# Patient Record
Sex: Female | Born: 1955 | Race: White | Hispanic: No | Smoking: Former smoker
Health system: Southern US, Community
[De-identification: ages and names within clinical notes are randomized; demographics above are authoritative.]

## PROBLEM LIST (undated history)

## (undated) DIAGNOSIS — Z9889 Other specified postprocedural states: Secondary | ICD-10-CM

## (undated) DIAGNOSIS — C801 Malignant (primary) neoplasm, unspecified: Secondary | ICD-10-CM

## (undated) DIAGNOSIS — K219 Gastro-esophageal reflux disease without esophagitis: Secondary | ICD-10-CM

## (undated) DIAGNOSIS — N189 Chronic kidney disease, unspecified: Secondary | ICD-10-CM

## (undated) DIAGNOSIS — I10 Essential (primary) hypertension: Secondary | ICD-10-CM

## (undated) DIAGNOSIS — R112 Nausea with vomiting, unspecified: Secondary | ICD-10-CM

## (undated) DIAGNOSIS — I2510 Atherosclerotic heart disease of native coronary artery without angina pectoris: Secondary | ICD-10-CM

## (undated) HISTORY — PX: LUMBAR FUSION: SHX111

## (undated) HISTORY — PX: TUBAL LIGATION: SHX77

## (undated) HISTORY — PX: CHOLECYSTECTOMY: SHX55

## (undated) HISTORY — PX: CARDIAC CATHETERIZATION: SHX172

## (undated) HISTORY — PX: APPENDECTOMY: SHX54

## (undated) HISTORY — PX: OTHER SURGICAL HISTORY: SHX169

## (undated) HISTORY — PX: BACK SURGERY: SHX140

## (undated) HISTORY — PX: JOINT REPLACEMENT: SHX530

---

## 2004-05-28 ENCOUNTER — Other Ambulatory Visit: Payer: Self-pay

## 2004-05-28 ENCOUNTER — Emergency Department: Payer: Self-pay | Admitting: Emergency Medicine

## 2004-06-13 ENCOUNTER — Ambulatory Visit: Payer: Self-pay | Admitting: Cardiovascular Disease

## 2004-08-13 ENCOUNTER — Emergency Department: Payer: Self-pay | Admitting: Emergency Medicine

## 2004-08-16 ENCOUNTER — Emergency Department: Payer: Self-pay | Admitting: Emergency Medicine

## 2004-11-05 ENCOUNTER — Emergency Department: Payer: Self-pay | Admitting: Internal Medicine

## 2007-05-30 ENCOUNTER — Other Ambulatory Visit: Payer: Self-pay

## 2007-05-30 ENCOUNTER — Ambulatory Visit: Payer: Self-pay | Admitting: Surgery

## 2007-06-02 ENCOUNTER — Ambulatory Visit: Payer: Self-pay | Admitting: Surgery

## 2007-10-10 ENCOUNTER — Inpatient Hospital Stay: Payer: Self-pay | Admitting: Internal Medicine

## 2008-03-28 ENCOUNTER — Emergency Department: Payer: Self-pay | Admitting: Emergency Medicine

## 2008-04-15 ENCOUNTER — Ambulatory Visit: Payer: Self-pay | Admitting: Orthopedic Surgery

## 2008-05-03 ENCOUNTER — Encounter: Payer: Self-pay | Admitting: Orthopedic Surgery

## 2008-05-22 ENCOUNTER — Encounter: Payer: Self-pay | Admitting: Orthopedic Surgery

## 2008-06-15 ENCOUNTER — Ambulatory Visit: Payer: Self-pay | Admitting: Orthopedic Surgery

## 2008-06-24 ENCOUNTER — Ambulatory Visit: Payer: Self-pay | Admitting: Orthopedic Surgery

## 2008-12-01 ENCOUNTER — Ambulatory Visit: Payer: Self-pay | Admitting: Orthopedic Surgery

## 2008-12-21 ENCOUNTER — Ambulatory Visit (HOSPITAL_COMMUNITY): Admission: RE | Admit: 2008-12-21 | Discharge: 2008-12-21 | Payer: Self-pay | Admitting: Neurological Surgery

## 2010-01-17 ENCOUNTER — Ambulatory Visit: Payer: Self-pay | Admitting: Internal Medicine

## 2010-01-22 ENCOUNTER — Ambulatory Visit: Payer: Self-pay | Admitting: Internal Medicine

## 2010-02-22 ENCOUNTER — Ambulatory Visit: Payer: Self-pay | Admitting: Internal Medicine

## 2010-04-26 LAB — CBC
RBC: 3.95 MIL/uL (ref 3.87–5.11)
WBC: 7.8 10*3/uL (ref 4.0–10.5)

## 2010-04-26 LAB — BASIC METABOLIC PANEL
CO2: 28 mEq/L (ref 19–32)
Chloride: 100 mEq/L (ref 96–112)
GFR calc Af Amer: >60 mL/min (ref >60)
Potassium: 4 mEq/L (ref 3.5–5.1)
Sodium: 135 mEq/L (ref 135–145)

## 2010-06-07 DIAGNOSIS — C44310 Basal cell carcinoma of skin of unspecified parts of face: Secondary | ICD-10-CM | POA: Insufficient documentation

## 2010-09-24 ENCOUNTER — Emergency Department: Payer: Self-pay | Admitting: Emergency Medicine

## 2011-01-03 DIAGNOSIS — L570 Actinic keratosis: Secondary | ICD-10-CM | POA: Insufficient documentation

## 2012-05-05 DIAGNOSIS — Z85828 Personal history of other malignant neoplasm of skin: Secondary | ICD-10-CM | POA: Insufficient documentation

## 2012-05-16 ENCOUNTER — Ambulatory Visit: Payer: Self-pay | Admitting: Internal Medicine

## 2012-10-22 ENCOUNTER — Ambulatory Visit: Payer: Self-pay | Admitting: Neurological Surgery

## 2012-12-05 DIAGNOSIS — K589 Irritable bowel syndrome without diarrhea: Secondary | ICD-10-CM | POA: Insufficient documentation

## 2012-12-05 DIAGNOSIS — K219 Gastro-esophageal reflux disease without esophagitis: Secondary | ICD-10-CM | POA: Insufficient documentation

## 2013-10-08 ENCOUNTER — Ambulatory Visit: Payer: Self-pay | Admitting: Cardiovascular Disease

## 2013-12-21 ENCOUNTER — Emergency Department: Payer: Self-pay | Admitting: Emergency Medicine

## 2013-12-21 LAB — CBC WITH DIFFERENTIAL/PLATELET
Basophil #: 0.1 10*3/uL (ref 0.0–0.1)
Basophil %: 0.4 %
EOS ABS: 0.1 10*3/uL (ref 0.0–0.7)
Eosinophil %: 0.7 %
HCT: 38.3 % (ref 35.0–47.0)
HGB: 12.8 g/dL (ref 12.0–16.0)
LYMPHS PCT: 8.9 %
Lymphocyte #: 1.4 10*3/uL (ref 1.0–3.6)
MCH: 34.3 pg — AB (ref 26.0–34.0)
MCHC: 33.5 g/dL (ref 32.0–36.0)
MCV: 102 fL — AB (ref 80–100)
MONOS PCT: 3.9 %
Monocyte #: 0.6 x10 3/mm (ref 0.2–0.9)
NEUTROS ABS: 13.4 10*3/uL — AB (ref 1.4–6.5)
NEUTROS PCT: 86.1 %
PLATELETS: 212 10*3/uL (ref 150–440)
RBC: 3.74 10*6/uL — AB (ref 3.80–5.20)
RDW: 13.2 % (ref 11.5–14.5)
WBC: 15.6 10*3/uL — ABNORMAL HIGH (ref 3.6–11.0)

## 2013-12-21 LAB — BASIC METABOLIC PANEL
Anion Gap: 8 (ref 7–16)
BUN: 13 mg/dL (ref 7–18)
CALCIUM: 8.6 mg/dL (ref 8.5–10.1)
CREATININE: 0.94 mg/dL (ref 0.60–1.30)
Chloride: 106 mmol/L (ref 98–107)
Co2: 22 mmol/L (ref 21–32)
EGFR (African American): >60
GLUCOSE: 121 mg/dL — AB (ref 65–99)
Osmolality: 273 (ref 275–301)
POTASSIUM: 3.9 mmol/L (ref 3.5–5.1)
Sodium: 136 mmol/L (ref 136–145)

## 2013-12-21 LAB — TROPONIN I: Troponin-I: <0.02 ng/mL

## 2015-02-17 DIAGNOSIS — M545 Low back pain, unspecified: Secondary | ICD-10-CM | POA: Insufficient documentation

## 2015-02-17 DIAGNOSIS — G8929 Other chronic pain: Secondary | ICD-10-CM | POA: Insufficient documentation

## 2015-02-17 DIAGNOSIS — M5410 Radiculopathy, site unspecified: Secondary | ICD-10-CM | POA: Insufficient documentation

## 2015-04-29 DIAGNOSIS — N183 Chronic kidney disease, stage 3 unspecified: Secondary | ICD-10-CM | POA: Insufficient documentation

## 2015-08-02 DIAGNOSIS — Z0289 Encounter for other administrative examinations: Secondary | ICD-10-CM | POA: Insufficient documentation

## 2015-08-23 DIAGNOSIS — M542 Cervicalgia: Secondary | ICD-10-CM | POA: Insufficient documentation

## 2015-11-02 DIAGNOSIS — F5101 Primary insomnia: Secondary | ICD-10-CM | POA: Insufficient documentation

## 2016-02-20 DIAGNOSIS — R1314 Dysphagia, pharyngoesophageal phase: Secondary | ICD-10-CM | POA: Insufficient documentation

## 2016-10-08 ENCOUNTER — Emergency Department
Admission: EM | Admit: 2016-10-08 | Discharge: 2016-10-08 | Disposition: A | Discharge status: Home / Self Care | Payer: 59 | Attending: Emergency Medicine | Admitting: Emergency Medicine

## 2016-10-08 DIAGNOSIS — L02411 Cutaneous abscess of right axilla: Secondary | ICD-10-CM | POA: Insufficient documentation

## 2016-10-08 DIAGNOSIS — L0291 Cutaneous abscess, unspecified: Secondary | ICD-10-CM

## 2016-10-08 DIAGNOSIS — R2231 Localized swelling, mass and lump, right upper limb: Secondary | ICD-10-CM | POA: Diagnosis present

## 2016-10-08 MED ORDER — CLINDAMYCIN HCL 300 MG PO CAPS: 300.0000 mg | ORAL_CAPSULE | Freq: Three times a day (TID) | ORAL | 0 refills | Status: AC

## 2016-10-08 MED ORDER — LIDOCAINE HCL (PF) 1 % IJ SOLN
5.0000 mL | Freq: Once | INTRAMUSCULAR | Status: AC
  Administered 2016-10-08: 5 mL via INTRADERMAL
  Filled 2016-10-08: qty 5

## 2016-10-08 NOTE — ED Provider Notes (Signed)
[facility] Emergency Department Provider Note  ____________________________________________  Time seen: Approximately 7:20 AM  I have reviewed the triage vital signs and the nursing notes.   HISTORY  Chief Complaint Abscess    HPI Monica Cobb is a 61 y.o. female presents to emergency department with painful swelling to right axilla for 8 days. No drainage. No fever, shortness breath, chest pain, nausea, vomiting, abdominal pain.   No past medical history on file.  There are no active problems to display for this patient.   No past surgical history on file.  Prior to Admission medications   Medication Sig Start Date End Date Taking? Authorizing Provider  clindamycin (CLEOCIN) 300 MG capsule Take 1 capsule (300 mg total) by mouth 3 (three) times daily. 10/08/16 10/18/16  Laban Emperor, PA-C    Allergies Patient has no known allergies.  No family history on file.  Social History Social History  Substance Use Topics  . Smoking status: Not on file  . Smokeless tobacco: Not on file  . Alcohol use Not on file     Review of Systems  Constitutional: No fever/chills Cardiovascular: No chest pain. Respiratory: No SOB. Gastrointestinal: No abdominal pain.  No nausea, no vomiting.  Musculoskeletal: Negative for musculoskeletal pain. Skin: Negative for  abrasions, lacerations, ecchymosis   ____________________________________________   PHYSICAL EXAM:  VITAL SIGNS: ED Triage Vitals  Enc Vitals Group     BP 10/08/16 0700 (!) 141/70     Pulse Rate 10/08/16 0700 96     Resp 10/08/16 0700 17     Temp 10/08/16 0700 97.9 F (36.6 C)     Temp Source 10/08/16 0700 Oral     SpO2 10/08/16 0700 96 %     Weight 10/08/16 0700 145 lb (65.8 kg)     Height 10/08/16 0700 5\' 3"  (1.6 m)     Head Circumference --      Peak Flow --      Pain Score 10/08/16 0659 0     Pain Loc --      Pain Edu? --      Excl. in [place]? --      Constitutional: Alert and  oriented. Well appearing and in no acute distress. Eyes: Conjunctivae are normal. PERRL. EOMI. Head: Atraumatic. ENT:      Ears:      Nose: No congestion/rhinnorhea.      Mouth/Throat: Mucous membranes are moist.  Neck: No stridor.   Cardiovascular: Normal rate, regular rhythm.  Good peripheral circulation. Respiratory: Normal respiratory effort without tachypnea or retractions. Lungs CTAB. Good air entry to the bases with no decreased or absent breath sounds.tion to all extremities. No gross deformities appreciated. Neurologic:  Normal speech and language. No gross focal neurologic deficits are appreciated.  Skin:  Skin is warm, dry. 1 cm x 1 cm area of erythema and fluctuance to right axilla. Tender to palpation.   ____________________________________________   LABS (all labs ordered are listed, but only abnormal results are displayed)  Labs Reviewed - No data to display ____________________________________________  EKG   ____________________________________________  RADIOLOGY  No results found.  ____________________________________________    PROCEDURES  Procedure(s) performed:    Procedures  INCISION AND DRAINAGE Performed by: Laban Emperor Consent: Verbal consent obtained. Risks and benefits: risks, benefits and alternatives were discussed Type: abscess  Body area: right axilla  Anesthesia: local infiltration  Incision was made with a scalpel.  Local anesthetic: lidocaine 1% without epinephrine  Anesthetic total: 3 ml  Complexity:  complex Blunt dissection to break up loculations  Drainage: purulent  Drainage amount: 3ccs  Packing material: 1/4 in iodoform gauze  Patient tolerance: Patient tolerated the procedure well with no immediate complications.    Medications  lidocaine (PF) (XYLOCAINE) 1 % injection 5 mL (not administered)     ____________________________________________   INITIAL IMPRESSION / ASSESSMENT AND PLAN / ED  COURSE  Pertinent labs & imaging results that were available during my care of the patient were reviewed by me and considered in my medical decision making (see chart for details).  Review of the [place] CSRS was performed in accordance of the [place] prior to dispensing any controlled drugs.     Patient's diagnosis is consistent with abscess. No signs and exam are reassuring. Abscess was drained and packed in the emergency department.   Patient will be discharged home with prescriptions for clindamycin. Patient is to follow up with PCP as directed. Patient is given ED precautions to return to the ED for any worsening or new symptoms.     ____________________________________________  FINAL CLINICAL IMPRESSION(S) / ED DIAGNOSES  Final diagnoses:  Abscess      NEW MEDICATIONS STARTED DURING THIS VISIT:  New Prescriptions   CLINDAMYCIN (CLEOCIN) 300 MG CAPSULE    Take 1 capsule (300 mg total) by mouth 3 (three) times daily.        This chart was dictated using voice recognition software/Dragon. Despite best efforts to proofread, errors can occur which can change the meaning. Any change was purely unintentional.    Laban Emperor, PA-C 10/08/16 0849    Earleen Newport, MD 10/08/16 [phone]

## 2016-10-08 NOTE — ED Triage Notes (Signed)
Pt reports abscess to right under arm area for 8 days; pain only on palpation; denies drainage; denies fever;

## 2016-10-08 NOTE — ED Notes (Signed)
See triage note  States she developed a red raised area under right arm for several days  Area is tender to touch   But no drainage noted

## 2017-08-05 DIAGNOSIS — E782 Mixed hyperlipidemia: Secondary | ICD-10-CM | POA: Insufficient documentation

## 2018-03-08 DIAGNOSIS — K529 Noninfective gastroenteritis and colitis, unspecified: Secondary | ICD-10-CM | POA: Insufficient documentation

## 2019-01-13 DIAGNOSIS — M1612 Unilateral primary osteoarthritis, left hip: Secondary | ICD-10-CM | POA: Insufficient documentation

## 2019-02-06 ENCOUNTER — Other Ambulatory Visit: Payer: Self-pay | Admitting: Family

## 2019-02-06 DIAGNOSIS — Z1231 Encounter for screening mammogram for malignant neoplasm of breast: Secondary | ICD-10-CM

## 2019-02-10 ENCOUNTER — Ambulatory Visit
Admission: RE | Admit: 2019-02-10 | Discharge: 2019-02-10 | Disposition: A | Discharge status: Home / Self Care | Payer: 59 | Source: Ambulatory Visit | Attending: Family | Admitting: Family

## 2019-02-10 ENCOUNTER — Other Ambulatory Visit: Payer: Self-pay

## 2019-02-10 DIAGNOSIS — Z1231 Encounter for screening mammogram for malignant neoplasm of breast: Secondary | ICD-10-CM | POA: Insufficient documentation

## 2019-02-17 ENCOUNTER — Inpatient Hospital Stay
Admission: RE | Admit: 2019-02-17 | Discharge: 2019-02-17 | Disposition: A | Discharge status: Home / Self Care | Payer: Self-pay | Source: Ambulatory Visit | Attending: *Deleted | Admitting: *Deleted

## 2019-02-17 ENCOUNTER — Other Ambulatory Visit: Payer: Self-pay | Admitting: *Deleted

## 2019-02-17 DIAGNOSIS — Z1231 Encounter for screening mammogram for malignant neoplasm of breast: Secondary | ICD-10-CM

## 2019-11-27 ENCOUNTER — Other Ambulatory Visit (HOSPITAL_COMMUNITY): Payer: Self-pay | Admitting: Nephrology

## 2019-11-27 ENCOUNTER — Other Ambulatory Visit: Payer: Self-pay | Admitting: Nephrology

## 2019-11-27 DIAGNOSIS — N200 Calculus of kidney: Secondary | ICD-10-CM

## 2019-11-27 DIAGNOSIS — N1831 Chronic kidney disease, stage 3a: Secondary | ICD-10-CM

## 2019-12-03 ENCOUNTER — Ambulatory Visit: Payer: 59

## 2019-12-03 ENCOUNTER — Ambulatory Visit
Admission: EM | Admit: 2019-12-03 | Discharge: 2019-12-03 | Disposition: A | Discharge status: Home / Self Care | Payer: 59 | Attending: Emergency Medicine | Admitting: Emergency Medicine

## 2019-12-03 ENCOUNTER — Other Ambulatory Visit: Payer: Self-pay

## 2019-12-03 DIAGNOSIS — J40 Bronchitis, not specified as acute or chronic: Secondary | ICD-10-CM | POA: Diagnosis not present

## 2019-12-03 DIAGNOSIS — R051 Acute cough: Secondary | ICD-10-CM | POA: Diagnosis present

## 2019-12-03 DIAGNOSIS — Z20822 Contact with and (suspected) exposure to covid-19: Secondary | ICD-10-CM | POA: Insufficient documentation

## 2019-12-03 DIAGNOSIS — Z87891 Personal history of nicotine dependence: Secondary | ICD-10-CM | POA: Insufficient documentation

## 2019-12-03 DIAGNOSIS — Z79890 Hormone replacement therapy (postmenopausal): Secondary | ICD-10-CM | POA: Insufficient documentation

## 2019-12-03 DIAGNOSIS — Z791 Long term (current) use of non-steroidal anti-inflammatories (NSAID): Secondary | ICD-10-CM | POA: Insufficient documentation

## 2019-12-03 DIAGNOSIS — Z79899 Other long term (current) drug therapy: Secondary | ICD-10-CM | POA: Diagnosis not present

## 2019-12-03 HISTORY — DX: Essential (primary) hypertension: I10

## 2019-12-03 LAB — SARS CORONAVIRUS 2 (TAT 6-24 HRS): SARS Coronavirus 2: NEGATIVE

## 2019-12-03 MED ORDER — AEROCHAMBER MV MISC
2 refills | Status: DC
Start: 2019-12-03 — End: 2021-03-21

## 2019-12-03 MED ORDER — PROMETHAZINE-DM 6.25-15 MG/5ML PO SYRP: 5.0000 mL | ORAL_SOLUTION | Freq: Four times a day (QID) | ORAL | 0 refills | Status: DC | PRN

## 2019-12-03 MED ORDER — ALBUTEROL SULFATE HFA 108 (90 BASE) MCG/ACT IN AERS: 2.0000 | INHALATION_SPRAY | RESPIRATORY_TRACT | 0 refills | Status: DC | PRN

## 2019-12-03 MED ORDER — BENZONATATE 200 MG PO CAPS: 200.0000 mg | ORAL_CAPSULE | Freq: Three times a day (TID) | ORAL | 1 refills | Status: DC | PRN

## 2019-12-03 NOTE — ED Provider Notes (Signed)
[facility]    CSN: [id number] Arrival date & time: 12/03/19  0801      History   Chief Complaint Chief Complaint  Patient presents with  . Cough    HPI Monica Cobb is a 64 y.o. female.   64 year old female here for evaluation of cough.  Patient reports that her symptoms started about 6 days ago.  She has had some wheezing and runny nose.  She says that she spent last week in the hospital with her husband as he was passing away and thinks he may have picked up something there.  Patient denies fever, shortness of breath, sinus pressure, sore throat, ear pressure, body aches, changes to taste or smell.  Patient has not received her Covid vaccination but she has been vaccinated against the flu.     Past Medical History:  Diagnosis Date  . Hypertension     There are no problems to display for this patient.   History reviewed. No pertinent surgical history.  OB History   No obstetric history on file.      Home Medications    Prior to Admission medications   Medication Sig Start Date End Date Taking? Authorizing Provider  amitriptyline (ELAVIL) 100 MG tablet Take by mouth. 03/04/18 11/08/20 Yes [provider]  atorvastatin (LIPITOR) 20 MG tablet Take by mouth. 03/13/18 02/06/20 Yes [provider]  calcipotriene (DOVONOX) 0.005 % cream Mix 1:1 with Efudex cream twice a day for 4 days on temples, forehead, and nose 11/14/17  Yes [provider]  clonazePAM (KLONOPIN) 1 MG tablet Take by mouth. 08/25/14  Yes [provider]  dicyclomine (BENTYL) 10 MG capsule TAKE 1 CAPSULE (10 MG TOTAL) BY MOUTH 4 (FOUR) TIMES A DAY AS NEEDED (ABDOMINAL PAIN/IBS) 10/13/19  Yes [provider]  ergocalciferol (VITAMIN D2) 1.25 MG (50000 UT) capsule TAKE 1 CAPSULE BY MOUTH ONCE A MONTH. 02/05/18  Yes [provider]  estradiol (CLIMARA - DOSED IN MG/24 HR) 0.05 mg/24hr patch APPLY 1 PATCH ONE TIME PER WEEK 01/28/18  Yes [provider]  furosemide (LASIX) 20 MG tablet Take by mouth. 10/06/19 10/05/20 Yes [provider]  Hyoscyamine Sulfate SL 0.125 MG SUBL Place under the tongue. 12/28/15  Yes [provider]  pantoprazole (PROTONIX) 40 MG tablet Take by mouth. 08/03/14 04/08/20 Yes [provider]  tiZANidine (ZANAFLEX) 4 MG tablet Take by mouth. 08/17/19  Yes [provider]  traMADol-acetaminophen (ULTRACET) 37.5-325 MG tablet Take by mouth. 07/19/14  Yes [provider]  valACYclovir (VALTREX) 1000 MG tablet Take by mouth. 12/04/17 05/17/20 Yes [provider]  valsartan-hydrochlorothiazide (DIOVAN-HCT) 320-12.5 MG tablet daily. 08/14/14  Yes [provider]  albuterol (VENTOLIN HFA) 108 (90 Base) MCG/ACT inhaler Inhale 2 puffs into the lungs every 4 (four) hours as needed for wheezing or shortness of breath. 12/03/19   Margarette Canada, NP  benzonatate (TESSALON) 200 MG capsule Take 1 capsule (200 mg total) by mouth 3 (three) times daily as needed for cough. 12/03/19   Margarette Canada, NP  promethazine-dextromethorphan (PROMETHAZINE-DM) 6.25-15 MG/5ML syrup Take 5 mLs by mouth 4 (four) times daily as needed. 12/03/19   Margarette Canada, NP  Spacer/Aero-Holding Josiah Lobo (AEROCHAMBER MV) inhaler Use as instructed 12/03/19   Margarette Canada, NP    Family History Family History  Problem Relation Age of Onset  . Breast cancer Maternal Grandmother     Social History Social History   Tobacco Use  . Smoking status: Former Smoker  Packs/day: 0.50    Types: Cigarettes  . Smokeless tobacco: Never Used  Vaping Use  . Vaping Use: Never used  Substance Use Topics  . Alcohol use: Not Currently  . Drug use: Never     Allergies   Patient has no known allergies.   Review of Systems Review of Systems  Constitutional: Negative for activity change, appetite change and fever.  HENT: Positive for rhinorrhea. Negative for congestion, ear pain, sinus pressure and sore  throat.   Respiratory: Positive for cough and wheezing. Negative for shortness of breath.   Cardiovascular: Negative for chest pain.  Gastrointestinal: Negative for abdominal pain, diarrhea, nausea and vomiting.  Musculoskeletal: Negative for arthralgias and myalgias.  Skin: Negative for rash.  Neurological: Negative for headaches.  Hematological: Negative.   Psychiatric/Behavioral: Negative.      Physical Exam Triage Vital Signs ED Triage Vitals  Enc Vitals Group     BP 12/03/19 0817 139/69     Pulse Rate 12/03/19 0817 98     Resp 12/03/19 0817 19     Temp 12/03/19 0817 98.3 F (36.8 C)     Temp Source 12/03/19 0817 Oral     SpO2 12/03/19 0817 98 %     Weight 12/03/19 0812 145 lb (65.8 kg)     Height 12/03/19 0812 5\' 3"  (1.6 m)     Head Circumference --      Peak Flow --      Pain Score 12/03/19 0812 0     Pain Loc --      Pain Edu? --      Excl. in [place]? --    No data found.  Updated Vital Signs BP 139/69 (BP Location: Left Arm)   Pulse 98   Temp 98.3 F (36.8 C) (Oral)   Resp 19   Ht 5\' 3"  (1.6 m)   Wt 145 lb (65.8 kg)   SpO2 98%   BMI 25.69 kg/m   Visual Acuity Right Eye Distance:   Left Eye Distance:   Bilateral Distance:    Right Eye Near:   Left Eye Near:    Bilateral Near:     Physical Exam Vitals and nursing note reviewed.  Constitutional:      General: She is not in acute distress.    Appearance: Normal appearance. She is not toxic-appearing.  HENT:     Head: Normocephalic and atraumatic.     Right Ear: Tympanic membrane, ear canal and external ear normal. There is no impacted cerumen.     Left Ear: Tympanic membrane, ear canal and external ear normal. There is no impacted cerumen.     Nose: Rhinorrhea present.     Comments: Patient has scant clear rhinorrhea without erythema or edema of nasal mucosa.    Mouth/Throat:     Mouth: Mucous membranes are moist.     Pharynx: Oropharynx is clear. No oropharyngeal exudate or posterior oropharyngeal  erythema.  Eyes:     General: No scleral icterus.    Extraocular Movements: Extraocular movements intact.     Conjunctiva/sclera: Conjunctivae normal.     Pupils: Pupils are equal, round, and reactive to light.  Cardiovascular:     Rate and Rhythm: Normal rate and regular rhythm.     Pulses: Normal pulses.     Heart sounds: Normal heart sounds. No murmur heard.  No gallop.   Pulmonary:     Effort: Pulmonary effort is normal.     Breath sounds: Normal breath sounds. No wheezing, rhonchi  or rales.  Musculoskeletal:        General: No swelling or tenderness. Normal range of motion.     Cervical back: Normal range of motion and neck supple.  Lymphadenopathy:     Cervical: No cervical adenopathy.  Skin:    General: Skin is warm and dry.     Capillary Refill: Capillary refill takes less than 2 seconds.     Findings: No erythema or rash.  Neurological:     General: No focal deficit present.     Mental Status: She is alert and oriented to person, place, and time.  Psychiatric:        Mood and Affect: Mood normal.        Behavior: Behavior normal.        Thought Content: Thought content normal.        Judgment: Judgment normal.      UC Treatments / Results  Labs (all labs ordered are listed, but only abnormal results are displayed) Labs Reviewed  SARS CORONAVIRUS 2 (TAT 6-24 HRS)    EKG   Radiology No results found.  Procedures Procedures (including critical care time)  Medications Ordered in UC Medications - No data to display  Initial Impression / Assessment and Plan / UC Course  I have reviewed the triage vital signs and the nursing notes.  Pertinent labs & imaging results that were available during my care of the patient were reviewed by me and considered in my medical decision making (see chart for details).   Patient is here for evaluation of cough with green sputum production that she had for the past 6 days.  Patient denies any fever.  No shortness of breath  intermittent wheezing.  Physical exam reveals faint rhonchi and wheezes in the upper and middle lobes bilaterally.  Will treat patient for bronchitis with albuterol inhaler and spacer, Tessalon Perles, and Promethazine DM for bedtime.  Patient structured to return or follow-up with her PCP for worsening symptoms.   Final Clinical Impressions(s) / UC Diagnoses   Final diagnoses:  Bronchitis     Discharge Instructions     Use the albuterol inhaler with the spacer, 2 puffs every 6 hours, as needed for wheezing or shortness of breath.  Continue the Tessalon Perles 200 mg every 8 hours as needed for cough.  Take it with a small sip of water.  Use the Promethazine DM every 6 hours as needed for severe cough.  It will make you drowsy so you may want a status for bedtime.  If your symptoms continue, you develop a productive cough for a rusty-looking sputum, or you develop a fever you need to return for reevaluation or see your PCP.    ED Prescriptions    Medication Sig Dispense Auth. Provider   benzonatate (TESSALON) 200 MG capsule Take 1 capsule (200 mg total) by mouth 3 (three) times daily as needed for cough. 20 capsule Margarette Canada, NP   promethazine-dextromethorphan (PROMETHAZINE-DM) 6.25-15 MG/5ML syrup Take 5 mLs by mouth 4 (four) times daily as needed. 118 mL Margarette Canada, NP   albuterol (VENTOLIN HFA) 108 (90 Base) MCG/ACT inhaler Inhale 2 puffs into the lungs every 4 (four) hours as needed for wheezing or shortness of breath. 18 g Margarette Canada, NP   Spacer/Aero-Holding Chambers (AEROCHAMBER MV) inhaler Use as instructed 1 each Margarette Canada, NP     PDMP not reviewed this encounter.   Margarette Canada, NP 12/03/19 [phone]

## 2019-12-03 NOTE — Discharge Instructions (Addendum)
Use the albuterol inhaler with the spacer, 2 puffs every 6 hours, as needed for wheezing or shortness of breath.  Continue the Tessalon Perles 200 mg every 8 hours as needed for cough.  Take it with a small sip of water.  Use the Promethazine DM every 6 hours as needed for severe cough.  It will make you drowsy so you may want a status for bedtime.  If your symptoms continue, you develop a productive cough for a rusty-looking sputum, or you develop a fever you need to return for reevaluation or see your PCP.

## 2019-12-03 NOTE — ED Triage Notes (Signed)
Pt with cough since Friday, coughing up green phlegm. No fever or bodyaches.

## 2020-11-22 ENCOUNTER — Ambulatory Visit: Payer: Self-pay

## 2020-11-22 ENCOUNTER — Encounter: Payer: Self-pay | Admitting: Orthopaedic Surgery

## 2020-11-22 ENCOUNTER — Ambulatory Visit (INDEPENDENT_AMBULATORY_CARE_PROVIDER_SITE_OTHER): Payer: 59 | Admitting: Orthopaedic Surgery

## 2020-11-22 ENCOUNTER — Other Ambulatory Visit: Payer: Self-pay

## 2020-11-22 DIAGNOSIS — M25551 Pain in right hip: Secondary | ICD-10-CM

## 2020-11-22 DIAGNOSIS — M87051 Idiopathic aseptic necrosis of right femur: Secondary | ICD-10-CM | POA: Diagnosis not present

## 2020-11-22 MED ORDER — TRAMADOL HCL 50 MG PO TABS: 50.0000 mg | ORAL_TABLET | Freq: Two times a day (BID) | ORAL | 0 refills | Status: DC | PRN

## 2020-11-22 NOTE — Progress Notes (Signed)
Office Visit Note   Patient: Monica Cobb           Date of Birth: Feb 14, 1955           MRN: [id number] Visit Date: 11/22/2020              Requested by: Darrick Huntsman, MD [place],  [place] [address] PCP: Darrick Huntsman, MD   Assessment & Plan: Visit Diagnoses:  1. Avascular necrosis of bone of right hip (HCC)     Plan: Impression is right femoral head avascular necrosis with recent collapse and worsening of symptoms.  X-rays are consistent with progressive deterioration of the femoral head.  Given these findings I have recommended a right total hip replacement.  Again we reviewed risk benefits rehab recovery all questions answered to her satisfaction.  Jackelyn Poling will speak with her today to schedule her surgery.  Tramadol refilled today.  Follow-Up Instructions: No follow-ups on file.   Orders:  Orders Placed This Encounter  Procedures   XR HIP UNILAT W OR W/O PELVIS 2-3 VIEWS RIGHT   Meds ordered this encounter  Medications   traMADol (ULTRAM) 50 MG tablet    Sig: Take 1-2 tablets (50-100 mg total) by mouth every 12 (twelve) hours as needed.    Dispense:  40 tablet    Refill:  0      Procedures: No procedures performed   Clinical Data: No additional findings.   Subjective: Chief Complaint  Patient presents with   Right Hip - Pain    Monica Cobb is a 65 year old female who comes in for right hip pain and groin pain since May of this year.  She underwent a left total hip replacement at [facility] in March 2021 for avascular necrosis.  This was done by Dr. Jaclynn Guarneri and she had an uneventful postoperative course.  Recently she has had severe pain in the right hip and has now had to use a Rollator.  She is unable to do any activities without severe pain.  She is at risk for falling.  Currently taking hydrocodone for the pain.   Review of Systems  Constitutional: Negative.   HENT: Negative.    Eyes: Negative.   Respiratory: Negative.    Cardiovascular:  Negative.   Endocrine: Negative.   Musculoskeletal: Negative.   Neurological: Negative.   Hematological: Negative.   Psychiatric/Behavioral: Negative.    All other systems reviewed and are negative.   Objective: Vital Signs: There were no vitals taken for this visit.  Physical Exam Vitals and nursing note reviewed.  Constitutional:      Appearance: She is well-developed.  Pulmonary:     Effort: Pulmonary effort is normal.  Skin:    General: Skin is warm.     Capillary Refill: Capillary refill takes less than 2 seconds.  Neurological:     Mental Status: She is alert and oriented to person, place, and time.  Psychiatric:        Behavior: Behavior normal.        Thought Content: Thought content normal.        Judgment: Judgment normal.    Ortho Exam  Right hip shows significant pain with attempted range of motion.  Antalgic gait with weightbearing.  Lateral hip is nontender.  Hip flexion is limited to 70 degrees with severe pain.  Specialty Comments:  No specialty comments available.  Imaging: XR HIP UNILAT W OR W/O PELVIS 2-3 VIEWS RIGHT  Result Date: 11/22/2020 Left total hip replacement without  complication.  Right femoral head collapse consistent with AVN.    PMFS History: Patient Active Problem List   Diagnosis Date Noted   Avascular necrosis of bone of right hip ([place]) 11/22/2020   Past Medical History:  Diagnosis Date   Hypertension     Family History  Problem Relation Age of Onset   Breast cancer Maternal Grandmother     History reviewed. No pertinent surgical history. Social History   Occupational History   Not on file  Tobacco Use   Smoking status: Former    Packs/day: 0.50    Types: Cigarettes   Smokeless tobacco: Never  Vaping Use   Vaping Use: Never used  Substance and Sexual Activity   Alcohol use: Not Currently   Drug use: Never   Sexual activity: Not on file

## 2020-12-08 ENCOUNTER — Other Ambulatory Visit: Payer: Self-pay

## 2020-12-09 ENCOUNTER — Emergency Department: Payer: 59

## 2020-12-09 ENCOUNTER — Inpatient Hospital Stay
Admission: EM | Admit: 2020-12-09 | Discharge: 2020-12-12 | DRG: 872 | Disposition: A | Discharge status: Home / Self Care | Payer: 59 | Attending: Internal Medicine | Admitting: Internal Medicine

## 2020-12-09 ENCOUNTER — Other Ambulatory Visit: Payer: Self-pay

## 2020-12-09 DIAGNOSIS — G4700 Insomnia, unspecified: Secondary | ICD-10-CM | POA: Diagnosis present

## 2020-12-09 DIAGNOSIS — L0100 Impetigo, unspecified: Secondary | ICD-10-CM | POA: Diagnosis present

## 2020-12-09 DIAGNOSIS — J020 Streptococcal pharyngitis: Secondary | ICD-10-CM | POA: Diagnosis present

## 2020-12-09 DIAGNOSIS — K219 Gastro-esophageal reflux disease without esophagitis: Secondary | ICD-10-CM | POA: Diagnosis present

## 2020-12-09 DIAGNOSIS — A419 Sepsis, unspecified organism: Secondary | ICD-10-CM | POA: Diagnosis not present

## 2020-12-09 DIAGNOSIS — J449 Chronic obstructive pulmonary disease, unspecified: Secondary | ICD-10-CM | POA: Diagnosis present

## 2020-12-09 DIAGNOSIS — Z981 Arthrodesis status: Secondary | ICD-10-CM

## 2020-12-09 DIAGNOSIS — Z87891 Personal history of nicotine dependence: Secondary | ICD-10-CM

## 2020-12-09 DIAGNOSIS — A400 Sepsis due to streptococcus, group A: Principal | ICD-10-CM | POA: Diagnosis present

## 2020-12-09 DIAGNOSIS — E871 Hypo-osmolality and hyponatremia: Secondary | ICD-10-CM | POA: Diagnosis present

## 2020-12-09 DIAGNOSIS — E785 Hyperlipidemia, unspecified: Secondary | ICD-10-CM | POA: Diagnosis present

## 2020-12-09 DIAGNOSIS — M549 Dorsalgia, unspecified: Secondary | ICD-10-CM

## 2020-12-09 DIAGNOSIS — M545 Low back pain, unspecified: Secondary | ICD-10-CM | POA: Diagnosis present

## 2020-12-09 DIAGNOSIS — Z20822 Contact with and (suspected) exposure to covid-19: Secondary | ICD-10-CM | POA: Diagnosis present

## 2020-12-09 DIAGNOSIS — F419 Anxiety disorder, unspecified: Secondary | ICD-10-CM

## 2020-12-09 DIAGNOSIS — Z28310 Unvaccinated for covid-19: Secondary | ICD-10-CM

## 2020-12-09 DIAGNOSIS — Z79890 Hormone replacement therapy (postmenopausal): Secondary | ICD-10-CM

## 2020-12-09 DIAGNOSIS — I10 Essential (primary) hypertension: Secondary | ICD-10-CM

## 2020-12-09 DIAGNOSIS — Z85828 Personal history of other malignant neoplasm of skin: Secondary | ICD-10-CM

## 2020-12-09 DIAGNOSIS — G8929 Other chronic pain: Secondary | ICD-10-CM | POA: Diagnosis present

## 2020-12-09 DIAGNOSIS — Z79899 Other long term (current) drug therapy: Secondary | ICD-10-CM

## 2020-12-09 LAB — COMPREHENSIVE METABOLIC PANEL
ALT: 51 U/L — ABNORMAL HIGH (ref 0–44)
AST: 41 U/L (ref 15–41)
Albumin: 3.7 g/dL (ref 3.5–5.0)
Alkaline Phosphatase: 105 U/L (ref 38–126)
Anion gap: 10 (ref 5–15)
BUN: 13 mg/dL (ref 8–23)
CO2: 25 mmol/L (ref 22–32)
Calcium: 8.7 mg/dL — ABNORMAL LOW (ref 8.9–10.3)
Chloride: 94 mmol/L — ABNORMAL LOW (ref 98–111)
Creatinine, Ser: 1.03 mg/dL — ABNORMAL HIGH (ref 0.44–1.00)
GFR, Estimated: >60 mL/min (ref >60)
Glucose, Bld: 121 mg/dL — ABNORMAL HIGH (ref 70–99)
Potassium: 3.9 mmol/L (ref 3.5–5.1)
Sodium: 129 mmol/L — ABNORMAL LOW (ref 135–145)
Total Bilirubin: 0.7 mg/dL (ref 0.3–1.2)
Total Protein: 7.6 g/dL (ref 6.5–8.1)

## 2020-12-09 LAB — LACTIC ACID, PLASMA
Lactic Acid, Venous: 1.6 mmol/L (ref 0.5–1.9)
Lactic Acid, Venous: 1.9 mmol/L (ref 0.5–1.9)

## 2020-12-09 LAB — URINALYSIS, COMPLETE (UACMP) WITH MICROSCOPIC
Bilirubin Urine: NEGATIVE
Glucose, UA: NEGATIVE mg/dL
Hgb urine dipstick: NEGATIVE
Ketones, ur: NEGATIVE mg/dL
Nitrite: NEGATIVE
Protein, ur: NEGATIVE mg/dL
Specific Gravity, Urine: 1.016 (ref 1.005–1.030)
pH: 5 (ref 5.0–8.0)

## 2020-12-09 LAB — URINALYSIS, ROUTINE W REFLEX MICROSCOPIC
Bilirubin Urine: NEGATIVE
Glucose, UA: NEGATIVE mg/dL
Hgb urine dipstick: NEGATIVE
Ketones, ur: NEGATIVE mg/dL
Leukocytes,Ua: NEGATIVE
Nitrite: NEGATIVE
Protein, ur: NEGATIVE mg/dL
Specific Gravity, Urine: 1.016 (ref 1.005–1.030)
pH: 5 (ref 5.0–8.0)

## 2020-12-09 LAB — CBC WITH DIFFERENTIAL/PLATELET
Abs Immature Granulocytes: 0.2 10*3/uL — ABNORMAL HIGH (ref 0.00–0.07)
Basophils Absolute: 0.1 10*3/uL (ref 0.0–0.1)
Basophils Relative: 0 %
Eosinophils Absolute: 0 10*3/uL (ref 0.0–0.5)
Eosinophils Relative: 0 %
HCT: 35.1 % — ABNORMAL LOW (ref 36.0–46.0)
Hemoglobin: 11.6 g/dL — ABNORMAL LOW (ref 12.0–15.0)
Immature Granulocytes: 1 %
Lymphocytes Relative: 6 %
Lymphs Abs: 1.5 10*3/uL (ref 0.7–4.0)
MCH: 29.5 pg (ref 26.0–34.0)
MCHC: 33 g/dL (ref 30.0–36.0)
MCV: 89.3 fL (ref 80.0–100.0)
Monocytes Absolute: 1.8 10*3/uL — ABNORMAL HIGH (ref 0.1–1.0)
Monocytes Relative: 8 %
Neutro Abs: 20.5 10*3/uL — ABNORMAL HIGH (ref 1.7–7.7)
Neutrophils Relative %: 85 %
Platelets: 253 10*3/uL (ref 150–400)
RBC: 3.93 MIL/uL (ref 3.87–5.11)
RDW: 16.1 % — ABNORMAL HIGH (ref 11.5–15.5)
WBC: 24.2 10*3/uL — ABNORMAL HIGH (ref 4.0–10.5)
nRBC: 0 % (ref 0.0–0.2)

## 2020-12-09 LAB — RESP PANEL BY RT-PCR (FLU A&B, COVID) ARPGX2
Influenza A by PCR: NEGATIVE
Influenza B by PCR: NEGATIVE
SARS Coronavirus 2 by RT PCR: NEGATIVE

## 2020-12-09 LAB — PROTIME-INR
INR: 1.2 (ref 0.8–1.2)
Prothrombin Time: 15 seconds (ref 11.4–15.2)

## 2020-12-09 LAB — APTT: aPTT: 35 seconds (ref 24–36)

## 2020-12-09 LAB — GROUP A STREP BY PCR: Group A Strep by PCR: DETECTED — AB

## 2020-12-09 MED ORDER — ONDANSETRON HCL 4 MG/2ML IJ SOLN: 4.0000 mg | Freq: Four times a day (QID) | INTRAMUSCULAR | Status: DC | PRN

## 2020-12-09 MED ORDER — ACETAMINOPHEN 325 MG PO TABS
650.0000 mg | ORAL_TABLET | Freq: Four times a day (QID) | ORAL | Status: DC | PRN
  Administered 2020-12-10 (×2): 650 mg via ORAL
  Filled 2020-12-09 (×2): qty 2

## 2020-12-09 MED ORDER — ACETAMINOPHEN 325 MG PO TABS: 650.0000 mg | ORAL_TABLET | Freq: Once | ORAL | Status: AC | PRN

## 2020-12-09 MED ORDER — LACTATED RINGERS IV SOLN: INTRAVENOUS | Status: AC

## 2020-12-09 MED ORDER — SODIUM CHLORIDE 0.9 % IV SOLN
2.0000 g | Freq: Once | INTRAVENOUS | Status: AC
  Administered 2020-12-09: 2 g via INTRAVENOUS
  Filled 2020-12-09: qty 2

## 2020-12-09 MED ORDER — VANCOMYCIN HCL 1500 MG/300ML IV SOLN
1500.0000 mg | Freq: Once | INTRAVENOUS | Status: AC
  Administered 2020-12-09: 1500 mg via INTRAVENOUS
  Filled 2020-12-09: qty 300

## 2020-12-09 MED ORDER — METRONIDAZOLE 500 MG/100ML IV SOLN: 500.0000 mg | Freq: Two times a day (BID) | INTRAVENOUS | Status: DC

## 2020-12-09 MED ORDER — ALBUTEROL SULFATE HFA 108 (90 BASE) MCG/ACT IN AERS: 2.0000 | INHALATION_SPRAY | RESPIRATORY_TRACT | Status: DC | PRN

## 2020-12-09 MED ORDER — ONDANSETRON HCL 4 MG PO TABS: 4.0000 mg | ORAL_TABLET | Freq: Four times a day (QID) | ORAL | Status: DC | PRN

## 2020-12-09 MED ORDER — KETOROLAC TROMETHAMINE 30 MG/ML IJ SOLN
30.0000 mg | Freq: Once | INTRAMUSCULAR | Status: AC
  Administered 2020-12-09: 30 mg via INTRAVENOUS
  Filled 2020-12-09: qty 1

## 2020-12-09 MED ORDER — ACETAMINOPHEN 650 MG RE SUPP
650.0000 mg | Freq: Four times a day (QID) | RECTAL | Status: DC | PRN
  Filled 2020-12-09: qty 1

## 2020-12-09 MED ORDER — ACETAMINOPHEN 325 MG PO TABS
ORAL_TABLET | ORAL | Status: AC
  Administered 2020-12-09: 650 mg via ORAL
  Filled 2020-12-09: qty 2

## 2020-12-09 MED ORDER — AMOXICILLIN-POT CLAVULANATE 500-125 MG PO TABS
1.0000 | ORAL_TABLET | Freq: Two times a day (BID) | ORAL | Status: DC
  Administered 2020-12-10 – 2020-12-12 (×6): 500 mg via ORAL
  Filled 2020-12-09 (×8): qty 1

## 2020-12-09 MED ORDER — LIDOCAINE HCL (PF) 1 % IJ SOLN
5.0000 mL | Freq: Once | INTRAMUSCULAR | Status: AC
  Administered 2020-12-09: 5 mL
  Filled 2020-12-09: qty 5

## 2020-12-09 MED ORDER — SODIUM CHLORIDE 0.9 % IV BOLUS (SEPSIS)
2000.0000 mL | Freq: Once | INTRAVENOUS | Status: AC
  Administered 2020-12-09: 2000 mL via INTRAVENOUS

## 2020-12-09 MED ORDER — HEPARIN SODIUM (PORCINE) 5000 UNIT/ML IJ SOLN
5000.0000 [IU] | Freq: Three times a day (TID) | INTRAMUSCULAR | Status: DC
  Administered 2020-12-10 – 2020-12-12 (×7): 5000 [IU] via SUBCUTANEOUS
  Filled 2020-12-09 (×5): qty 1

## 2020-12-09 MED ORDER — LORAZEPAM 2 MG/ML IJ SOLN
1.0000 mg | Freq: Once | INTRAMUSCULAR | Status: AC
  Administered 2020-12-09: 1 mg via INTRAVENOUS
  Filled 2020-12-09: qty 1

## 2020-12-09 MED ORDER — ATORVASTATIN CALCIUM 20 MG PO TABS
20.0000 mg | ORAL_TABLET | Freq: Every day | ORAL | Status: DC
  Administered 2020-12-10 – 2020-12-11 (×3): 20 mg via ORAL
  Filled 2020-12-09 (×2): qty 1

## 2020-12-09 MED ORDER — VANCOMYCIN HCL IN DEXTROSE 1-5 GM/200ML-% IV SOLN: 1000.0000 mg | Freq: Once | INTRAVENOUS | Status: DC

## 2020-12-09 MED ORDER — METRONIDAZOLE 500 MG/100ML IV SOLN
500.0000 mg | Freq: Once | INTRAVENOUS | Status: DC
  Filled 2020-12-09: qty 100

## 2020-12-09 NOTE — Sepsis Progress Note (Signed)
Following for sepsis monitoring ?

## 2020-12-09 NOTE — ED Notes (Signed)
Report from [place], rn.

## 2020-12-09 NOTE — ED Notes (Signed)
Per dr. Kerman Passey hold antibiotics until lumbar puncture performed.

## 2020-12-09 NOTE — ED Notes (Signed)
Paper consent placed in chart for LP.

## 2020-12-09 NOTE — H&P (Signed)
History and Physical   Monica Cobb HQP:[id number] DOB: [birth year] DOA: 12/09/2020  PCP: Darrick Huntsman, MD  Outpatient Specialists: Dr. Meriel Pica, [facility] Patient coming from: home  I have personally briefly reviewed patient's old medical records in [place].  Chief Concern: Neck pain and fever  HPI: Monica Cobb is a 65 y.o. female with medical history significant for hypertension, anxiety, chronic pain, chronic low back pain status post lumbar fusion, hyperlipidemia, history of basal cell carcinoma at the right foot, who presents emergency department for chief concerns of neck pain and fever.  She reports bilateral shoulder and neck soreness started on 12/08/2020 and was a gradual onset. She feels like there is something stuck in her chest and throat..  She reports never feeling this way before.  She reports a sensation and neck and shoulder soreness are still there.  She endorses a mild headache.  She reports that her grandson's daycare had kids that had strep throat this week. She reports that her throat has been feeling rough and scratchy when she ate, and this started around Monday. She denies loc, syncope, chest pain, shortness of breath,dysuria, cough.  She denies nausea, vomiting, diarrhea, swelling in her legs, vision changes.  She endorses fever with Tmax of 105 at home.   At bedside there is tonsillar exudate on physical exam, L>R, and mild erythema bilaterally.  Patient had small impetigo rash at the edge of her mouth, approximately 5 to 6 mm in diameter.  Social history: She lives at home and her daughter and grandson lives with her. She is a former tobacco smoker, and smoked at her peak 1 ppd and quit about 1 month ago. She denies etoh and recreational drug use. She is retired and formerly worked in Orthoptist at [employer].   Vaccination history: She is not vaccinated for covid 19   ROS: Constitutional: no weight change, + fever ENT/Mouth: + sore throat, no  rhinorrhea Eyes: no eye pain, no vision changes Cardiovascular: no chest pain, no dyspnea,  no edema, no palpitations Respiratory: no cough, no sputum, no wheezing Gastrointestinal: no nausea, no vomiting, no diarrhea, no constipation Genitourinary: no urinary incontinence, no dysuria, no hematuria Musculoskeletal: no arthralgias, + myalgias, + neck and shoulder pain Skin: no skin lesions, no pruritus, Neuro: + weakness, no loss of consciousness, no syncope Psych: no anxiety, no depression, + decrease appetite Heme/Lymph: no bruising, no bleeding  ED Course: Discussed with emergency medicine provider, patient requiring hospitalization for chief concerns of meeting sepsis criteria.  Initial concern was for meningitis as patient was having shoulder and low neck pain.  Negative for nuchal rigidity.  Vitals in the emergency room was remarkable for T-max of 102.3 and improved to 99.2, respiration rate of 20, heart rate of 107, blood pressure 124/63, SPO2 of 97% on room air.  Labs in the emergency department was remarkable for sodium 129, potassium 3.9, chloride 94, bicarb 25, BUN of 13, serum creatinine of 1.03, nonfasting blood glucose 121, GFR greater than 60, WBC elevated at 24.2, hemoglobin 11.6, platelets 253.  Lactic acid was 1.6 and the second was 1.9.  UA was positive for trace leukocytes.  Assessment/Plan  Principal Problem:   Sepsis ([place]) Active Problems:   Anxiety   Back pain   Pharyngitis, streptococcal, acute   Primary hypertension   Impetigo   # Meets criteria for sepsis with elevated heart rate, elevated temperature, leukocytosis, possible source of strep a, however given patient's presentation of persistent neck pain and  headache, meningitis cannot be excluded at this time - Ordered Augmentin 500 mg p.o. twice daily, 10-day course - Patient was status post vancomycin, cefepime, metronidazole per EDP - Patient had no acute abdomen and was negative with deep palpation,  metronidazole has been discontinued by myself - ED provider attempted lumbar puncture however was unsuccessful as patient has diffuse lumbar spine - Would recommend a.m. team to consider consultation with infectious disease whether or not lumbar puncture is indicated at this time - Blood cultures x2, CSF panel ordered by EDP, cortisol in the a.m., magnesium, phosphorus, procalcitonin ordered - Given the initial presentation concerning for meningitis, acyclovir was ordered - CT of the head without contrast ordered  # History of hypertension-Per family medicine clinic note on 08/24/2020, was to continue valsartan 80 mg daily - I have not resumed antihypertensives given patient's initial presentation of sepsis - Acceptable SBP of 105-170  # Hyperlipidemia-atorvastatin 20 mg nightly resumed  # Anxiety-resumed clonazepam at 1 mg nightly as needed for anxiety, 1 dose ordered  # GERD-PPI resumed  # Patient endorses insomnia-melatonin 5 mg nightly ordered  # A.m. team to complete med reconciliation  Chart reviewed.   DVT prophylaxis: Heparin 5000 units subcutaneous every 8 hours Code Status: full code  Diet: Heart healthy Family Communication: No patient states that her family knows she is in the hospital Disposition Plan: Pending clinical course anticipate less than 2 midnights Consults called: None at this time, a.m. team to consider ID and/or IR consultation Admission status: Progressive cardiac, observation  Past Medical History:  Diagnosis Date   Hypertension    Past Surgical History:  Procedure Laterality Date   LUMBAR FUSION     Social History:  reports that she has quit smoking. Her smoking use included cigarettes. She smoked an average of 1 pack per day. She has never used smokeless tobacco. She reports that she does not currently use alcohol. She reports that she does not use drugs.  No Known Allergies Family History  Problem Relation Age of Onset   Breast cancer Maternal  Grandmother    Family history: Family history reviewed and not pertinent  Prior to Admission medications   Medication Sig Start Date End Date Taking? Authorizing Provider  albuterol (VENTOLIN HFA) 108 (90 Base) MCG/ACT inhaler Inhale 2 puffs into the lungs every 4 (four) hours as needed for wheezing or shortness of breath. 12/03/19   Margarette Canada, NP  amitriptyline (ELAVIL) 100 MG tablet Take by mouth. 03/04/18 11/08/20  [provider]  atorvastatin (LIPITOR) 20 MG tablet Take by mouth. 03/13/18 02/06/20  [provider]  benzonatate (TESSALON) 200 MG capsule Take 1 capsule (200 mg total) by mouth 3 (three) times daily as needed for cough. 12/03/19   Margarette Canada, NP  calcipotriene (DOVONOX) 0.005 % cream Mix 1:1 with Efudex cream twice a day for 4 days on temples, forehead, and nose 11/14/17   [provider]  clonazePAM (KLONOPIN) 1 MG tablet Take by mouth. 08/25/14   [provider]  dicyclomine (BENTYL) 10 MG capsule TAKE 1 CAPSULE (10 MG TOTAL) BY MOUTH 4 (FOUR) TIMES A DAY AS NEEDED (ABDOMINAL PAIN/IBS) 10/13/19   [provider]  ergocalciferol (VITAMIN D2) 1.25 MG (50000 UT) capsule TAKE 1 CAPSULE BY MOUTH ONCE A MONTH. 02/05/18   [provider]  estradiol (CLIMARA - DOSED IN MG/24 HR) 0.05 mg/24hr patch APPLY 1 PATCH ONE TIME PER WEEK 01/28/18   [provider]  furosemide (LASIX) 20 MG tablet Take by  mouth. 10/06/19 10/05/20  [provider]  Hyoscyamine Sulfate SL 0.125 MG SUBL Place under the tongue. 12/28/15   [provider]  pantoprazole (PROTONIX) 40 MG tablet Take by mouth. 08/03/14 04/08/20  [provider]  promethazine-dextromethorphan (PROMETHAZINE-DM) 6.25-15 MG/5ML syrup Take 5 mLs by mouth 4 (four) times daily as needed. 12/03/19   Margarette Canada, NP  Spacer/Aero-Holding Josiah Lobo (AEROCHAMBER MV) inhaler Use as instructed 12/03/19   Margarette Canada, NP  tiZANidine (ZANAFLEX) 4 MG tablet Take by mouth.  08/17/19   [provider]  traMADol (ULTRAM) 50 MG tablet Take 1-2 tablets (50-100 mg total) by mouth every 12 (twelve) hours as needed. 11/22/20   Leandrew Koyanagi, MD  traMADol-acetaminophen Caroline Sauger) 37.5-325 MG tablet Take by mouth. 07/19/14   [provider]  valsartan-hydrochlorothiazide (DIOVAN-HCT) 320-12.5 MG tablet daily. 08/14/14   [provider]   Physical Exam: Vitals:   12/09/20 1932 12/09/20 2200 12/09/20 2248 12/09/20 2300  BP: 124/63 (!) 129/58  (!) 105/50  Pulse: (!) 107 99  92  Resp: 20 20  11   Temp: (!) 102.3 F (39.1 C)  99.2 F (37.3 C)   TempSrc: Oral  Oral   SpO2: 97% 97%  96%  Weight:      Height:       Constitutional: appears age-appropriate, NAD, calm, comfortable Eyes: PERRL, lids and conjunctivae normal ENMT: Mucous membranes are dry. Posterior pharynx positive for exudate or mild erythema. Age-appropriate dentition. Hearing appropriate Neck: normal, supple, no masses, no thyromegaly Respiratory: clear to auscultation bilaterally, no wheezing, no crackles. Normal respiratory effort. No accessory muscle use.  Cardiovascular: Regular rate and rhythm, no murmurs / rubs / gallops. No extremity edema. 2+ pedal pulses. No carotid bruits.  Abdomen: Obese abdomen, no tenderness, no masses palpated, no hepatosplenomegaly. Bowel sounds positive.  Musculoskeletal: no clubbing / cyanosis. No joint deformity upper and lower extremities. Good ROM, no contractures, no atrophy. Normal muscle tone.  Skin: no rashes, lesions, ulcers. No induration Neurologic: Sensation intact. Strength 5/5 in all 4.  Psychiatric: Normal judgment and insight. Alert and oriented x 3. Normal mood.   EKG: independently reviewed, showing sinus rhythm with a rate of 92, QTc 456  Chest x-ray on Admission: I personally reviewed and I agree with radiologist reading as below.  CT HEAD WO CONTRAST (5MM)  Result Date: 12/09/2020 CLINICAL DATA:  Fever, chills, body aches and  headaches. EXAM: CT HEAD WITHOUT CONTRAST TECHNIQUE: Contiguous axial images were obtained from the base of the skull through the vertex without intravenous contrast. COMPARISON:  None. FINDINGS: Brain: No evidence of acute infarction, hemorrhage, hydrocephalus, extra-axial collection or mass lesion/mass effect. Vascular: No hyperdense vessel or unexpected calcification. Skull: Normal. Negative for fracture or focal lesion. Sinuses/Orbits: No acute finding. Other: None. IMPRESSION: No acute intracranial pathology. Electronically Signed   By: Virgina Norfolk M.D.   On: 12/09/2020 23:40   DG Chest Port 1 View  Result Date: 12/09/2020 CLINICAL DATA:  Neck pain. EXAM: PORTABLE CHEST 1 VIEW COMPARISON:  December 21, 2013 FINDINGS: Diffuse, chronic appearing increased lung markings are seen. There is no evidence of acute infiltrate, pleural effusion or pneumothorax. The heart size and mediastinal contours are within normal limits. Moderate severity calcification of the aortic arch is seen. The visualized skeletal structures are unremarkable. IMPRESSION: No acute or active cardiopulmonary disease. Electronically Signed   By: Virgina Norfolk M.D.   On: 12/09/2020 22:12    Labs on Admission: I have personally reviewed following labs  CBC: Recent  Labs  Lab 12/09/20 1944  WBC 24.2*  NEUTROABS 20.5*  HGB 11.6*  HCT 35.1*  MCV 89.3  PLT 937   Basic Metabolic Panel: Recent Labs  Lab 12/09/20 1944  NA 129*  K 3.9  CL 94*  CO2 25  GLUCOSE 121*  BUN 13  CREATININE 1.03*  CALCIUM 8.7*   GFR: Estimated Creatinine Clearance: 51.3 mL/min (A) (by C-G formula based on SCr of 1.03 mg/dL (H)).  Liver Function Tests: Recent Labs  Lab 12/09/20 1944  AST 41  ALT 51*  ALKPHOS 105  BILITOT 0.7  PROT 7.6  ALBUMIN 3.7   Coagulation Profile: Recent Labs  Lab 12/09/20 2206  INR 1.2   Urine analysis:    Component Value Date/Time   COLORURINE YELLOW (A) 12/09/2020 2206   COLORURINE YELLOW  (A) 12/09/2020 2206   APPEARANCEUR HAZY (A) 12/09/2020 2206   APPEARANCEUR HAZY (A) 12/09/2020 2206   LABSPEC 1.016 12/09/2020 2206   LABSPEC 1.016 12/09/2020 2206   PHURINE 5.0 12/09/2020 2206   PHURINE 5.0 12/09/2020 [place] 12/09/2020 [place] 12/09/2020 [place] 12/09/2020 [place] 12/09/2020 [place] 12/09/2020 [place] 12/09/2020 2206   KETONESUR NEGATIVE 12/09/2020 2206   [place] 12/09/2020 [place] 12/09/2020 2206   PROTEINUR NEGATIVE 12/09/2020 2206   NITRITE NEGATIVE 12/09/2020 2206   NITRITE NEGATIVE 12/09/2020 2206   LEUKOCYTESUR NEGATIVE 12/09/2020 2206   LEUKOCYTESUR TRACE (A) 12/09/2020 2206   CRITICAL CARE Performed by: Briant Cedar Tomoko Sandra  Total critical care time: 35 minutes  Critical care time was exclusive of separately billable procedures and treating other patients.  Critical care was necessary to treat or prevent imminent or life-threatening deterioration.  Sepsis secondary to group A, meningitis  Critical care was time spent personally by me on the following activities: development of treatment plan with patient and/or surrogate as well as nursing, discussions with consultants, evaluation of patient's response to treatment, examination of patient, obtaining history from patient or surrogate, ordering and performing treatments and interventions, ordering and review of laboratory studies, ordering and review of radiographic studies, pulse oximetry and re-evaluation of patient's condition.  Dr. Tobie Poet [facility]  If 7PM-7AM, please contact overnight-coverage provider If 7AM-7PM, please contact day coverage provider [email or web address]  12/10/2020, 12:20 AM

## 2020-12-09 NOTE — ED Provider Notes (Signed)
[facility] Emergency Department Provider Note  Time seen: 10:00 PM  I have reviewed the triage vital signs and the nursing notes.   HISTORY  Chief Complaint Fever   HPI Monica Cobb is a 65 y.o. female with a past medical history of hypertension who presents to the emergency department for fever chills body aches and weakness.  According to the patient for the past 4 days or so she has been experiencing fever, body aches, generalized fatigue and weakness.  States intermittent headaches.  States discomfort in her shoulders bilaterally into her chest at times.  Patient states fever as high as 103 today.  Patient was seen by physician earlier today, had a negative COVID/flu PCR test.  Patient denies any cough congestion chest pain abdominal pain nausea vomiting or diarrhea.  No dysuria or vaginal symptoms.  No cuts or scrapes.  No dental pain.   Past Medical History:  Diagnosis Date   Hypertension     Patient Active Problem List   Diagnosis Date Noted   Avascular necrosis of bone of right hip ([place]) 11/22/2020    No past surgical history on file.  Prior to Admission medications   Medication Sig Start Date End Date Taking? Authorizing Provider  albuterol (VENTOLIN HFA) 108 (90 Base) MCG/ACT inhaler Inhale 2 puffs into the lungs every 4 (four) hours as needed for wheezing or shortness of breath. 12/03/19   Margarette Canada, NP  amitriptyline (ELAVIL) 100 MG tablet Take by mouth. 03/04/18 11/08/20  [provider]  atorvastatin (LIPITOR) 20 MG tablet Take by mouth. 03/13/18 02/06/20  [provider]  benzonatate (TESSALON) 200 MG capsule Take 1 capsule (200 mg total) by mouth 3 (three) times daily as needed for cough. 12/03/19   Margarette Canada, NP  calcipotriene (DOVONOX) 0.005 % cream Mix 1:1 with Efudex cream twice a day for 4 days on temples, forehead, and nose 11/14/17   [provider]  clonazePAM (KLONOPIN) 1 MG tablet Take by mouth. 08/25/14    [provider]  dicyclomine (BENTYL) 10 MG capsule TAKE 1 CAPSULE (10 MG TOTAL) BY MOUTH 4 (FOUR) TIMES A DAY AS NEEDED (ABDOMINAL PAIN/IBS) 10/13/19   [provider]  ergocalciferol (VITAMIN D2) 1.25 MG (50000 UT) capsule TAKE 1 CAPSULE BY MOUTH ONCE A MONTH. 02/05/18   [provider]  estradiol (CLIMARA - DOSED IN MG/24 HR) 0.05 mg/24hr patch APPLY 1 PATCH ONE TIME PER WEEK 01/28/18   [provider]  furosemide (LASIX) 20 MG tablet Take by mouth. 10/06/19 10/05/20  [provider]  Hyoscyamine Sulfate SL 0.125 MG SUBL Place under the tongue. 12/28/15   [provider]  pantoprazole (PROTONIX) 40 MG tablet Take by mouth. 08/03/14 04/08/20  [provider]  promethazine-dextromethorphan (PROMETHAZINE-DM) 6.25-15 MG/5ML syrup Take 5 mLs by mouth 4 (four) times daily as needed. 12/03/19   Margarette Canada, NP  Spacer/Aero-Holding Josiah Lobo (AEROCHAMBER MV) inhaler Use as instructed 12/03/19   Margarette Canada, NP  tiZANidine (ZANAFLEX) 4 MG tablet Take by mouth. 08/17/19   [provider]  traMADol (ULTRAM) 50 MG tablet Take 1-2 tablets (50-100 mg total) by mouth every 12 (twelve) hours as needed. 11/22/20   Leandrew Koyanagi, MD  traMADol-acetaminophen Caroline Sauger) 37.5-325 MG tablet Take by mouth. 07/19/14   [provider]  valsartan-hydrochlorothiazide (DIOVAN-HCT) 320-12.5 MG tablet daily. 08/14/14   [provider]    No Known Allergies  Family History  Problem Relation Age of Onset   Breast cancer Maternal Grandmother  Social History Social History   Tobacco Use   Smoking status: Former    Packs/day: 0.50    Types: Cigarettes   Smokeless tobacco: Never  Vaping Use   Vaping Use: Never used  Substance Use Topics   Alcohol use: Not Currently   Drug use: Never    Review of Systems Constitutional: Positive for fever Cardiovascular: Negative for chest pain. Respiratory: Negative for shortness of  breath. Gastrointestinal: Negative for abdominal pain, vomiting and diarrhea. Genitourinary: Negative for urinary compaints Musculoskeletal: Negative for musculoskeletal complaints Skin: Small little rash to her right cheek/lip Neurological: Intermittent headache All other ROS negative  ____________________________________________   PHYSICAL EXAM:  VITAL SIGNS: ED Triage Vitals [12/09/20 1932]  Enc Vitals Group     BP 124/63     Pulse Rate (!) 108     Resp 16     Temp (!) 102.3 F (39.1 C)     Temp Source Oral     SpO2 100 %     Weight 147 lb (66.7 kg)     Height 5\' 3"  (1.6 m)     Head Circumference      Peak Flow      Pain Score 7     Pain Loc      Pain Edu?      Excl. in [place]?    Constitutional: Alert and oriented. Well appearing and in no distress. Eyes: Normal exam ENT      Head: Normocephalic and atraumatic.      Mouth/Throat: Mucous membranes are moist. Cardiovascular: Normal rate, regular rhythm around 100 bpm. Respiratory: Normal respiratory effort without tachypnea nor retractions. Breath sounds are clear  Gastrointestinal: Soft and nontender. No distention. Musculoskeletal: Nontender with normal range of motion in all extremities.  Neurologic:  Normal speech and language. No gross focal neurologic deficits  Skin: Small little rash to her right face/right upper lip, possibly representing HSV. Psychiatric: Mood and affect are normal.  ____________________________________________    EKG  EKG viewed and interpreted by myself shows a normal sinus rhythm at 92 bpm with a narrow QRS, normal axis, normal intervals, nonspecific but no concerning ST changes.  ____________________________________________    RADIOLOGY  Chest x-ray negative  ____________________________________________   INITIAL IMPRESSION / ASSESSMENT AND PLAN / ED COURSE  Pertinent labs & imaging results that were available during my care of the patient were reviewed by me and considered  in my medical decision making (see chart for details).   Patient presents emergency department for fever chills headache generalized fatigue.  Patient's lab work shows significant leukocytosis of 24,000 fever 1-2.3 tachycardic meeting sepsis criteria.  We will check additional labs, cultures, start on broad-spectrum antibiotics and continue to closely monitor.  No obvious infectious source at this time.  Patient denies any cough congestion abdominal pain.  Patient does have a small rash to her face possibly indicating HSV, with the patient's intermittent headaches neck and shoulder pain fever and elevated white blood cell count she would likely benefit from lumbar puncture to evaluate for possible HSV meningitis/encephalitis.  Spoke to the patient regarding lumbar puncture, she is agreeable to plan however patient has had extensive back surgery including lumbar fusions.  Given the very difficult anatomy we will hold off on lumbar puncture until the patient can be done under CT guidance by IR tomorrow.  Very low suspicion for bacterial meningitis, if anything I would be suspicious for HSV/viral meningitis.  We will continue with the plan of admission to the hospital,  IV antibiotics.  Leshea Jaggers was evaluated in Emergency Department on 12/09/2020 for the symptoms described in the history of present illness. She was evaluated in the context of the global COVID-19 pandemic, which necessitated consideration that the patient might be at risk for infection with the SARS-CoV-2 virus that causes COVID-19. Institutional protocols and algorithms that pertain to the evaluation of patients at risk for COVID-19 are in a state of rapid change based on information released by regulatory bodies including the CDC and federal and state organizations. These policies and algorithms were followed during the patient's care in the ED.  CRITICAL CARE Performed by: Harvest Dark   Total critical care time: 30  minutes  Critical care time was exclusive of separately billable procedures and treating other patients.  Critical care was necessary to treat or prevent imminent or life-threatening deterioration.  Critical care was time spent personally by me on the following activities: development of treatment plan with patient and/or surrogate as well as nursing, discussions with consultants, evaluation of patient's response to treatment, examination of patient, obtaining history from patient or surrogate, ordering and performing treatments and interventions, ordering and review of laboratory studies, ordering and review of radiographic studies, pulse oximetry and re-evaluation of patient's condition.  ____________________________________________   FINAL CLINICAL IMPRESSION(S) / ED DIAGNOSES  Sepsis   Harvest Dark, MD 12/09/20 2315

## 2020-12-09 NOTE — ED Triage Notes (Signed)
Pt states was at [facility] this am for similar symptoms. Pt states she has been having neck pain, fever. Pt denies congestion. Pt denies rash. Pt states is also having a headache. Pt states was negative for covid and flu this am.

## 2020-12-09 NOTE — ED Notes (Signed)
Md to wait on lp, hang antibiotics per md.

## 2020-12-09 NOTE — Progress Notes (Signed)
CODE SEPSIS - PHARMACY COMMUNICATION  **Broad Spectrum Antibiotics should be administered within 1 hour of Sepsis diagnosis**  Time Code Sepsis Called/Page Received: 2155  Antibiotics Ordered: Cefepime and Vancomycin  Time of 1st antibiotic administration: [place], PharmD, [facility] 12/09/2020 11:43 PM

## 2020-12-09 NOTE — Consult Note (Signed)
PHARMACY -  BRIEF ANTIBIOTIC NOTE   Pharmacy has received consult(s) for vancomycin and cefepime from an ED provider.  The patient's profile has been reviewed for ht/wt/allergies/indication/available labs.    One time order(s) placed for cefepime 2 g and vancomycin 1500 mg IV  Further antibiotics/pharmacy consults should be ordered by admitting physician if indicated.                       Thank you, Darnelle Bos, PharmD 12/09/2020  10:00 PM

## 2020-12-10 ENCOUNTER — Encounter: Payer: Self-pay | Admitting: Internal Medicine

## 2020-12-10 DIAGNOSIS — A419 Sepsis, unspecified organism: Secondary | ICD-10-CM | POA: Diagnosis present

## 2020-12-10 DIAGNOSIS — Z85828 Personal history of other malignant neoplasm of skin: Secondary | ICD-10-CM | POA: Diagnosis not present

## 2020-12-10 DIAGNOSIS — K219 Gastro-esophageal reflux disease without esophagitis: Secondary | ICD-10-CM | POA: Diagnosis present

## 2020-12-10 DIAGNOSIS — A400 Sepsis due to streptococcus, group A: Secondary | ICD-10-CM | POA: Diagnosis present

## 2020-12-10 DIAGNOSIS — Z20822 Contact with and (suspected) exposure to covid-19: Secondary | ICD-10-CM | POA: Diagnosis present

## 2020-12-10 DIAGNOSIS — L0100 Impetigo, unspecified: Secondary | ICD-10-CM | POA: Diagnosis present

## 2020-12-10 DIAGNOSIS — J020 Streptococcal pharyngitis: Secondary | ICD-10-CM | POA: Diagnosis present

## 2020-12-10 DIAGNOSIS — Z981 Arthrodesis status: Secondary | ICD-10-CM | POA: Diagnosis not present

## 2020-12-10 DIAGNOSIS — E871 Hypo-osmolality and hyponatremia: Secondary | ICD-10-CM | POA: Diagnosis present

## 2020-12-10 DIAGNOSIS — E785 Hyperlipidemia, unspecified: Secondary | ICD-10-CM | POA: Diagnosis present

## 2020-12-10 DIAGNOSIS — F419 Anxiety disorder, unspecified: Secondary | ICD-10-CM

## 2020-12-10 DIAGNOSIS — M545 Low back pain, unspecified: Secondary | ICD-10-CM | POA: Diagnosis present

## 2020-12-10 DIAGNOSIS — I10 Essential (primary) hypertension: Secondary | ICD-10-CM

## 2020-12-10 DIAGNOSIS — Z87891 Personal history of nicotine dependence: Secondary | ICD-10-CM | POA: Diagnosis not present

## 2020-12-10 DIAGNOSIS — Z79890 Hormone replacement therapy (postmenopausal): Secondary | ICD-10-CM | POA: Diagnosis not present

## 2020-12-10 DIAGNOSIS — Z28310 Unvaccinated for covid-19: Secondary | ICD-10-CM | POA: Diagnosis not present

## 2020-12-10 DIAGNOSIS — G4700 Insomnia, unspecified: Secondary | ICD-10-CM | POA: Diagnosis present

## 2020-12-10 DIAGNOSIS — Z79899 Other long term (current) drug therapy: Secondary | ICD-10-CM | POA: Diagnosis not present

## 2020-12-10 DIAGNOSIS — J449 Chronic obstructive pulmonary disease, unspecified: Secondary | ICD-10-CM | POA: Diagnosis present

## 2020-12-10 DIAGNOSIS — G8929 Other chronic pain: Secondary | ICD-10-CM | POA: Diagnosis present

## 2020-12-10 LAB — CBC WITH DIFFERENTIAL/PLATELET
Abs Immature Granulocytes: 0.45 10*3/uL — ABNORMAL HIGH (ref 0.00–0.07)
Basophils Absolute: 0.1 10*3/uL (ref 0.0–0.1)
Basophils Relative: 0 %
Eosinophils Absolute: 0 10*3/uL (ref 0.0–0.5)
Eosinophils Relative: 0 %
HCT: 29.5 % — ABNORMAL LOW (ref 36.0–46.0)
Hemoglobin: 9.6 g/dL — ABNORMAL LOW (ref 12.0–15.0)
Immature Granulocytes: 2 %
Lymphocytes Relative: 4 %
Lymphs Abs: 0.9 10*3/uL (ref 0.7–4.0)
MCH: 29.1 pg (ref 26.0–34.0)
MCHC: 32.5 g/dL (ref 30.0–36.0)
MCV: 89.4 fL (ref 80.0–100.0)
Monocytes Absolute: 1.5 10*3/uL — ABNORMAL HIGH (ref 0.1–1.0)
Monocytes Relative: 6 %
Neutro Abs: 19.9 10*3/uL — ABNORMAL HIGH (ref 1.7–7.7)
Neutrophils Relative %: 88 %
Platelets: 207 10*3/uL (ref 150–400)
RBC: 3.3 MIL/uL — ABNORMAL LOW (ref 3.87–5.11)
RDW: 16.6 % — ABNORMAL HIGH (ref 11.5–15.5)
WBC: 22.9 10*3/uL — ABNORMAL HIGH (ref 4.0–10.5)
nRBC: 0 % (ref 0.0–0.2)

## 2020-12-10 LAB — PHOSPHORUS: Phosphorus: 2.9 mg/dL (ref 2.5–4.6)

## 2020-12-10 LAB — BASIC METABOLIC PANEL
Anion gap: 7 (ref 5–15)
BUN: 14 mg/dL (ref 8–23)
CO2: 23 mmol/L (ref 22–32)
Calcium: 8 mg/dL — ABNORMAL LOW (ref 8.9–10.3)
Chloride: 102 mmol/L (ref 98–111)
Creatinine, Ser: 0.97 mg/dL (ref 0.44–1.00)
GFR, Estimated: >60 mL/min (ref >60)
Glucose, Bld: 120 mg/dL — ABNORMAL HIGH (ref 70–99)
Potassium: 3 mmol/L — ABNORMAL LOW (ref 3.5–5.1)
Sodium: 132 mmol/L — ABNORMAL LOW (ref 135–145)

## 2020-12-10 LAB — PROTIME-INR
INR: 1.3 — ABNORMAL HIGH (ref 0.8–1.2)
Prothrombin Time: 15.9 seconds — ABNORMAL HIGH (ref 11.4–15.2)

## 2020-12-10 LAB — PROCALCITONIN: Procalcitonin: 1.9 ng/mL

## 2020-12-10 LAB — CORTISOL-AM, BLOOD: Cortisol - AM: 19 ug/dL (ref 6.7–22.6)

## 2020-12-10 LAB — MAGNESIUM: Magnesium: 1.8 mg/dL (ref 1.7–2.4)

## 2020-12-10 LAB — HIV ANTIBODY (ROUTINE TESTING W REFLEX): HIV Screen 4th Generation wRfx: NONREACTIVE

## 2020-12-10 MED ORDER — VANCOMYCIN HCL 500 MG/100ML IV SOLN
500.0000 mg | Freq: Two times a day (BID) | INTRAVENOUS | Status: DC
  Administered 2020-12-10: 500 mg via INTRAVENOUS
  Filled 2020-12-10: qty 100

## 2020-12-10 MED ORDER — ALBUTEROL SULFATE (2.5 MG/3ML) 0.083% IN NEBU: 2.5000 mg | INHALATION_SOLUTION | RESPIRATORY_TRACT | Status: DC | PRN

## 2020-12-10 MED ORDER — MELATONIN 5 MG PO TABS
5.0000 mg | ORAL_TABLET | Freq: Every day | ORAL | Status: DC
  Administered 2020-12-10 – 2020-12-11 (×2): 5 mg via ORAL
  Filled 2020-12-10 (×2): qty 1

## 2020-12-10 MED ORDER — SODIUM CHLORIDE 0.9 % IV SOLN
2.0000 g | Freq: Two times a day (BID) | INTRAVENOUS | Status: DC
  Administered 2020-12-10 – 2020-12-11 (×3): 2 g via INTRAVENOUS
  Filled 2020-12-10 (×4): qty 2

## 2020-12-10 MED ORDER — POTASSIUM CHLORIDE IN NACL 20-0.9 MEQ/L-% IV SOLN
INTRAVENOUS | Status: DC
  Filled 2020-12-10 (×5): qty 1000

## 2020-12-10 MED ORDER — BENZONATATE 100 MG PO CAPS: 200.0000 mg | ORAL_CAPSULE | Freq: Three times a day (TID) | ORAL | Status: DC | PRN

## 2020-12-10 MED ORDER — CLONAZEPAM 1 MG PO TABS
1.0000 mg | ORAL_TABLET | Freq: Every evening | ORAL | Status: AC | PRN
  Administered 2020-12-11: 1 mg via ORAL
  Filled 2020-12-10: qty 1

## 2020-12-10 MED ORDER — DEXTROSE 5 % IV SOLN
10.0000 mg/kg | Freq: Three times a day (TID) | INTRAVENOUS | Status: DC
  Administered 2020-12-10: 580 mg via INTRAVENOUS
  Filled 2020-12-10 (×5): qty 11.6

## 2020-12-10 MED ORDER — TRAMADOL HCL 50 MG PO TABS
50.0000 mg | ORAL_TABLET | Freq: Two times a day (BID) | ORAL | Status: AC | PRN
  Administered 2020-12-10: 50 mg via ORAL
  Administered 2020-12-11 (×2): 100 mg via ORAL
  Filled 2020-12-10: qty 1
  Filled 2020-12-10: qty 2
  Filled 2020-12-10: qty 1
  Filled 2020-12-10: qty 2

## 2020-12-10 MED ORDER — PANTOPRAZOLE SODIUM 40 MG PO TBEC
40.0000 mg | DELAYED_RELEASE_TABLET | Freq: Every morning | ORAL | Status: DC
  Administered 2020-12-10 – 2020-12-12 (×3): 40 mg via ORAL
  Filled 2020-12-10 (×3): qty 1

## 2020-12-10 MED ORDER — VANCOMYCIN HCL IN DEXTROSE 1-5 GM/200ML-% IV SOLN: 1000.0000 mg | INTRAVENOUS | Status: DC

## 2020-12-10 MED ORDER — AMITRIPTYLINE HCL 25 MG PO TABS
100.0000 mg | ORAL_TABLET | Freq: Every day | ORAL | Status: DC
  Administered 2020-12-10 – 2020-12-11 (×3): 100 mg via ORAL
  Filled 2020-12-10 (×2): qty 2
  Filled 2020-12-10: qty 4

## 2020-12-10 NOTE — ED Notes (Signed)
Pt up to restroom to void.  

## 2020-12-10 NOTE — Progress Notes (Signed)
Pharmacy Antibiotic Note  Monica Cobb is a 65 y.o. female admitted on 12/09/2020 with sepsis from unknown source. 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 Pharmacy has been consulted for vancomycin, cefepime, and acyclovir dosing.  Plan: Change to vancomycin 500 mg IV q12h for goal trough 15-20 for concern for meningitis  Acyclovir 10 mg/kg (ABW) q8h and cefepime 2 gm q12h per indication and current renal fxn.   Height: 5\' 3"  (160 cm) Weight: 66.7 kg (147 lb) IBW/kg (Calculated) : 52.4  Temp (24hrs), Avg:100.4 F (38 C), Min:98.4 F (36.9 C), Max:102.3 F (39.1 C)  Recent Labs  Lab 12/09/20 1944 12/09/20 2205 12/10/20 0720  WBC 24.2*  --  22.9*  CREATININE 1.03*  --  0.97  LATICACIDVEN 1.6 1.9  --      Estimated Creatinine Clearance: 54.4 mL/min (by C-G formula based on SCr of 0.97 mg/dL).    No Known Allergies  Antimicrobials this admission: 11/18 Cefepime >> 11/19 Vancomycin  >> 11/19 Acyclovir >> 11/19 Augmentin >> x 10 days  Microbiology results: 11/18 BCx: Pending 11/18 UCx: Pending  11/18 Strep A PCR: Detected  Thank you for allowing pharmacy to be a part of this patient's care.  Tawnya Crook, PharmD, BCPS Clinical Pharmacist 12/10/2020 8:54 AM

## 2020-12-10 NOTE — ED Notes (Signed)
Secure chat message sent to brenda morrison, np to notify of elevated hr.

## 2020-12-10 NOTE — Progress Notes (Signed)
Pharmacy Antibiotic Note  Monica Cobb is a 65 y.o. female admitted on 12/09/2020 with sepsis from unknown source. 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 Pharmacy has been consulted for Cefepime & Vancomycin dosing x 7 days and Acyclovir dosing.  Plan: Acyclovir 10 mg/kg (ABW) q8h and Cefepime 2 gm q12h per indication and current renal fxn.  Vancomycin Pt given initial dose of Vanc 1500 mg. Vancomycin 1000 mg IV Q 24 hrs.  Goal AUC 400-550. Expected AUC: 486.7 SCr used: 1.03  Pharmacy will continue to follow and will adjust dosing when warranted.  Height: 5\' 3"  (160 cm) Weight: 66.7 kg (147 lb) IBW/kg (Calculated) : 52.4  Temp (24hrs), Avg:101.3 F (38.5 C), Min:99.2 F (37.3 C), Max:102.3 F (39.1 C)  Recent Labs  Lab 12/09/20 1944 12/09/20 2205  WBC 24.2*  --   CREATININE 1.03*  --   LATICACIDVEN 1.6 1.9    Estimated Creatinine Clearance: 51.3 mL/min (A) (by C-G formula based on SCr of 1.03 mg/dL (H)).    No Known Allergies  Antimicrobials this admission: 11/18 Cefepime >> x 7 days 11/19 Vancomycin  >> 7 days 11/19 Acyclovir >> 11/19 Augmentin >> x 10 days  Microbiology results: 11/18 BCx: Pending 11/18 UCx: Pending  11/18 CSF Cx: Pending  11/18 Strep A PCR: Detected  Thank you for allowing pharmacy to be a part of this patient's care.  Renda Rolls, PharmD, MBA 12/10/2020 2:20 AM

## 2020-12-10 NOTE — ED Notes (Signed)
Waiting on acyclovir from pharmacy.

## 2020-12-10 NOTE — Progress Notes (Signed)
[place] [facility] at [place] NAME: Monica Cobb    MR#:  [id number]  DATE OF BIRTH:  1955/08/03  SUBJECTIVE:  came in with throat pain and fever generalized body ache. Denies neck rigidity today. Patient was exposed to grandson who is daycare had positive strep reported. No nausea vomiting. Tmax 101.7. No family at bedside. Complains of dysphagia. Tolerating fluids  REVIEW OF SYSTEMS:   Review of Systems  Constitutional:  Positive for malaise/fatigue. Negative for chills, fever and weight loss.  HENT:  Positive for sore throat. Negative for ear discharge, ear pain and nosebleeds.   Eyes:  Negative for blurred vision, pain and discharge.  Respiratory:  Negative for sputum production, shortness of breath, wheezing and stridor.   Cardiovascular:  Negative for chest pain, palpitations, orthopnea and PND.  Gastrointestinal:  Negative for abdominal pain, diarrhea, nausea and vomiting.  Genitourinary:  Negative for frequency and urgency.  Musculoskeletal:  Positive for back pain, joint pain, myalgias and neck pain.  Neurological:  Positive for weakness. Negative for sensory change, speech change and focal weakness.  Psychiatric/Behavioral:  Negative for depression and hallucinations. The patient is not nervous/anxious.   Tolerating Diet:yes, some Tolerating PT:   DRUG ALLERGIES:  No Known Allergies  VITALS:  Blood pressure (!) 111/49, pulse (!) 108, temperature 98.5 F (36.9 C), temperature source Oral, resp. rate (!) 24, height 5\' 3"  (1.6 m), weight 66.7 kg, SpO2 97 %.  PHYSICAL EXAMINATION:   Physical Exam  GENERAL:  65 y.o.-year-old patient lying in the bed with no acute distress.  HEENT: Head atraumatic, normocephalic. Oropharynx and nasopharynx clear. No neck rigidity LUNGS: Normal breath sounds bilaterally, no wheezing, rales, rhonchi. No use of accessory muscles of respiration.  CARDIOVASCULAR: S1, S2 normal. No murmurs, rubs, or gallops.   ABDOMEN: Soft, nontender, nondistended. Bowel sounds present. No organomegaly or mass.  EXTREMITIES: No cyanosis, clubbing or edema b/l.    NEUROLOGIC: nonfocal PSYCHIATRIC:  patient is alert and oriented x 3.  SKIN: No obvious rash, lesion, or ulcer.   LABORATORY PANEL:  CBC Recent Labs  Lab 12/10/20 0720  WBC 22.9*  HGB 9.6*  HCT 29.5*  PLT 207    Chemistries  Recent Labs  Lab 12/09/20 1944 12/10/20 0720  NA 129* 132*  K 3.9 3.0*  CL 94* 102  CO2 25 23  GLUCOSE 121* 120*  BUN 13 14  CREATININE 1.03* 0.97  CALCIUM 8.7* 8.0*  MG  --  1.8  AST 41  --   ALT 51*  --   ALKPHOS 105  --   BILITOT 0.7  --    Cardiac Enzymes No results for input(s): TROPONINI in the last 168 hours. RADIOLOGY:  CT HEAD WO CONTRAST (5MM)  Result Date: 12/09/2020 CLINICAL DATA:  Fever, chills, body aches and headaches. EXAM: CT HEAD WITHOUT CONTRAST TECHNIQUE: Contiguous axial images were obtained from the base of the skull through the vertex without intravenous contrast. COMPARISON:  None. FINDINGS: Brain: No evidence of acute infarction, hemorrhage, hydrocephalus, extra-axial collection or mass lesion/mass effect. Vascular: No hyperdense vessel or unexpected calcification. Skull: Normal. Negative for fracture or focal lesion. Sinuses/Orbits: No acute finding. Other: None. IMPRESSION: No acute intracranial pathology. Electronically Signed   By: Virgina Norfolk M.D.   On: 12/09/2020 23:40   DG Chest Port 1 View  Result Date: 12/09/2020 CLINICAL DATA:  Neck pain. EXAM: PORTABLE CHEST 1 VIEW COMPARISON:  December 21, 2013 FINDINGS: Diffuse, chronic appearing increased lung markings  are seen. There is no evidence of acute infiltrate, pleural effusion or pneumothorax. The heart size and mediastinal contours are within normal limits. Moderate severity calcification of the aortic arch is seen. The visualized skeletal structures are unremarkable. IMPRESSION: No acute or active cardiopulmonary  disease. Electronically Signed   By: Virgina Norfolk M.D.   On: 12/09/2020 22:12   ASSESSMENT AND PLAN:    Monica Cobb is a 65 y.o. female with medical history significant for hypertension, anxiety, chronic pain, chronic low back pain status post lumbar fusion, hyperlipidemia, history of basal cell carcinoma at the right foot, who presents emergency department for chief concerns of throat pain and fever.  She reports bilateral shoulder and neck soreness started on 12/08/2020 and was a gradual onset. She feels like there is something stuck in her chest and throat..  She reports never feeling this way before.  Sepsis on presentation group a strep positive -- patient came in with high-grade fever, tachycardia, leukocytosis, positive group a strep throat -- low clinical suspicion for meningitis -- patient able to move her neck without any neck rigidity. She is alert oriented times three -- elevated Pro calcitonin --- hemodynamically stable -- continue IV fluids -- continue amoxicillin and cefepime --d/ced Vanc and acyclovir for now--d/w ID on call -- ER physician was unable to perform LP secondary to patient's lumbar fusion surgery on chronic back pain. -- Discussed with patient today she is not keen on getting LP done. She feels better than yesterday. Will continue to monitor. -- Blood culture so far negative  Leukocytosis -- came in with white count of 24.2-- 22.9  COPD with chronic chest x-ray changes -- sats 93 to 97% on room air  hyponatremia with AK I in the setting of sepsis -- came in with creatinine of 1.03-- .9 -- 129 sodium--- improved to 132  Gerd -- continue PPI hyperlipidemia continue statins  chronic anxiety -- continue clonazepam  history of hypertension --Blood pressure soft --hold valsartan     Procedures: Family communication : none Consults : CODE STATUS: full DVT Prophylaxis : heparin Level of care: Progressive Status is: Inpatient  Remains  inpatient appropriate because: sepsis        TOTAL TIME TAKING CARE OF THIS PATIENT: 35 minutes.  >50% time spent on counselling and coordination of care  Note: This dictation was prepared with Dragon dictation along with smaller phrase technology. Any transcriptional errors that result from this process are unintentional.  Fritzi Mandes M.D    [facility]   CC: Primary care physician; Darrick Huntsman, MD Patient ID: Maralyn Sago, female   DOB: 1955/04/01, 65 y.o.   MRN: [id number]

## 2020-12-10 NOTE — ED Notes (Signed)
Report to [place], rn.

## 2020-12-10 NOTE — ED Notes (Signed)
Pt bedding changed. Lines checked

## 2020-12-10 NOTE — ED Notes (Signed)
Per [employer], np continue to monitor hr, no new orders received.

## 2020-12-11 DIAGNOSIS — M545 Low back pain, unspecified: Secondary | ICD-10-CM | POA: Diagnosis not present

## 2020-12-11 DIAGNOSIS — J020 Streptococcal pharyngitis: Secondary | ICD-10-CM | POA: Diagnosis not present

## 2020-12-11 DIAGNOSIS — F419 Anxiety disorder, unspecified: Secondary | ICD-10-CM | POA: Diagnosis not present

## 2020-12-11 DIAGNOSIS — A400 Sepsis due to streptococcus, group A: Secondary | ICD-10-CM | POA: Diagnosis not present

## 2020-12-11 LAB — BASIC METABOLIC PANEL
Anion gap: 6 (ref 5–15)
BUN: 10 mg/dL (ref 8–23)
CO2: 23 mmol/L (ref 22–32)
Calcium: 8.4 mg/dL — ABNORMAL LOW (ref 8.9–10.3)
Chloride: 108 mmol/L (ref 98–111)
Creatinine, Ser: 0.69 mg/dL (ref 0.44–1.00)
GFR, Estimated: >60 mL/min (ref >60)
Glucose, Bld: 116 mg/dL — ABNORMAL HIGH (ref 70–99)
Potassium: 3.1 mmol/L — ABNORMAL LOW (ref 3.5–5.1)
Sodium: 137 mmol/L (ref 135–145)

## 2020-12-11 LAB — CBC
HCT: 27 % — ABNORMAL LOW (ref 36.0–46.0)
Hemoglobin: 9 g/dL — ABNORMAL LOW (ref 12.0–15.0)
MCH: 29.4 pg (ref 26.0–34.0)
MCHC: 33.3 g/dL (ref 30.0–36.0)
MCV: 88.2 fL (ref 80.0–100.0)
Platelets: 205 10*3/uL (ref 150–400)
RBC: 3.06 MIL/uL — ABNORMAL LOW (ref 3.87–5.11)
RDW: 16.8 % — ABNORMAL HIGH (ref 11.5–15.5)
WBC: 19.1 10*3/uL — ABNORMAL HIGH (ref 4.0–10.5)
nRBC: 0 % (ref 0.0–0.2)

## 2020-12-11 LAB — URINE CULTURE: Culture: NO GROWTH

## 2020-12-11 MED ORDER — TIZANIDINE HCL 4 MG PO TABS
4.0000 mg | ORAL_TABLET | Freq: Every evening | ORAL | Status: DC | PRN
  Filled 2020-12-11: qty 1

## 2020-12-11 NOTE — ED Notes (Signed)
This RN called lab at this time and verified they are coming to collect this pt's labs.

## 2020-12-11 NOTE — ED Notes (Signed)
Pt sleeping. 

## 2020-12-11 NOTE — Progress Notes (Signed)
[facility] at [place] NAME: Monica Cobb    MR#:  [id number]  DATE OF BIRTH:  Feb 23, 1955  SUBJECTIVE:  came in with throat pain and fever generalized body ache. Denies neck rigidity today. Patient was exposed to grandson who is daycare had positive strep reported. No nausea vomiting. Tmax 101.7.  No family in the room. Patient feels a whole lot better. No fever. Wondering when she can go home.    REVIEW OF SYSTEMS:   Review of Systems  Constitutional:  Negative for chills, fever and weight loss.  HENT:  Positive for sore throat. Negative for ear discharge, ear pain and nosebleeds.   Eyes:  Negative for blurred vision, pain and discharge.  Respiratory:  Negative for sputum production, shortness of breath, wheezing and stridor.   Cardiovascular:  Negative for chest pain, palpitations, orthopnea and PND.  Gastrointestinal:  Negative for abdominal pain, diarrhea, nausea and vomiting.  Genitourinary:  Negative for frequency and urgency.  Musculoskeletal:  Positive for back pain and joint pain.  Neurological:  Positive for weakness. Negative for sensory change, speech change and focal weakness.  Psychiatric/Behavioral:  Negative for depression and hallucinations. The patient is not nervous/anxious.   Tolerating Diet:yes, some Tolerating PT: self ambulatory  DRUG ALLERGIES:  No Known Allergies  VITALS:  Blood pressure (!) 129/59, pulse 88, temperature 98.2 F (36.8 C), resp. rate 18, height 5\' 3"  (1.6 m), weight 66.5 kg, SpO2 100 %.  PHYSICAL EXAMINATION:   Physical Exam  GENERAL:  65 y.o.-year-old patient lying in the bed with no acute distress.  HEENT: Head atraumatic, normocephalic. Oropharynx and nasopharynx clear. No neck rigidity LUNGS: Normal breath sounds bilaterally, no wheezing, rales, rhonchi. No use of accessory muscles of respiration.  CARDIOVASCULAR: S1, S2 normal. No murmurs, rubs, or gallops.  ABDOMEN: Soft, nontender,  nondistended. Bowel sounds present. No organomegaly or mass.  EXTREMITIES: No cyanosis, clubbing or edema b/l.    NEUROLOGIC: nonfocal PSYCHIATRIC:  patient is alert and oriented x 3.  SKIN: No obvious rash, lesion, or ulcer.   LABORATORY PANEL:  CBC Recent Labs  Lab 12/11/20 0847  WBC 19.1*  HGB 9.0*  HCT 27.0*  PLT 205     Chemistries  Recent Labs  Lab 12/09/20 1944 12/10/20 0720 12/11/20 0847  NA 129* 132* 137  K 3.9 3.0* 3.1*  CL 94* 102 108  CO2 25 23 23   GLUCOSE 121* 120* 116*  BUN 13 14 10   CREATININE 1.03* 0.97 0.69  CALCIUM 8.7* 8.0* 8.4*  MG  --  1.8  --   AST 41  --   --   ALT 51*  --   --   ALKPHOS 105  --   --   BILITOT 0.7  --   --     Cardiac Enzymes No results for input(s): TROPONINI in the last 168 hours. RADIOLOGY:  CT HEAD WO CONTRAST (5MM)  Result Date: 12/09/2020 CLINICAL DATA:  Fever, chills, body aches and headaches. EXAM: CT HEAD WITHOUT CONTRAST TECHNIQUE: Contiguous axial images were obtained from the base of the skull through the vertex without intravenous contrast. COMPARISON:  None. FINDINGS: Brain: No evidence of acute infarction, hemorrhage, hydrocephalus, extra-axial collection or mass lesion/mass effect. Vascular: No hyperdense vessel or unexpected calcification. Skull: Normal. Negative for fracture or focal lesion. Sinuses/Orbits: No acute finding. Other: None. IMPRESSION: No acute intracranial pathology. Electronically Signed   By: Virgina Norfolk M.D.   On: 12/09/2020 23:40   DG  Chest Port 1 View  Result Date: 12/09/2020 CLINICAL DATA:  Neck pain. EXAM: PORTABLE CHEST 1 VIEW COMPARISON:  December 21, 2013 FINDINGS: Diffuse, chronic appearing increased lung markings are seen. There is no evidence of acute infiltrate, pleural effusion or pneumothorax. The heart size and mediastinal contours are within normal limits. Moderate severity calcification of the aortic arch is seen. The visualized skeletal structures are unremarkable.  IMPRESSION: No acute or active cardiopulmonary disease. Electronically Signed   By: Virgina Norfolk M.D.   On: 12/09/2020 22:12   ASSESSMENT AND PLAN:    Monica Cobb is a 65 y.o. female with medical history significant for hypertension, anxiety, chronic pain, chronic low back pain status post lumbar fusion, hyperlipidemia, history of basal cell carcinoma at the right foot, who presents emergency department for chief concerns of throat pain and fever.  She reports bilateral shoulder and neck soreness started on 12/08/2020 and was a gradual onset. She feels like there is something stuck in her chest and throat..  She reports never feeling this way before.  Sepsis on presentation group a strep positive -- patient came in with high-grade fever, tachycardia, leukocytosis, positive group a strep throat -- low clinical suspicion for meningitis -- patient able to move her neck without any neck rigidity. She is alert oriented times three -- elevated Pro calcitonin --- hemodynamically stable -- received IV fluids -- continue amoxicillin and cefepime --d/ced Vanc and acyclovir for now--d/w ID on call -- ER physician was unable to perform LP secondary to patient's lumbar fusion surgery on chronic back pain. -- Discussed with patient today she is not keen on getting LP done. She feels better than yesterday. Will continue to monitor. -- Blood culture so far negative -- sepsis ruled out -- continue Augmentin for group a strep.  Leukocytosis -- came in with white count of 24.2-- 22.9--19 -- afebrile  COPD with chronic chest x-ray changes -- sats 93 to 97% on room air  hyponatremia with AK I in the setting of sepsis -- came in with creatinine of 1.03-- .9 -- 129 sodium--- improved to 132  Gerd -- continue PPI hyperlipidemia continue statins  chronic anxiety -- continue clonazepam  history of hypertension --Blood pressure soft -- patient tells me she does not take any blood pressure meds at  home     Family communication : none Consults : CODE STATUS: full DVT Prophylaxis : heparin Level of care: Progressive Status is: Inpatient  Remains inpatient appropriate because: sepsis  if continues to show improvement will discharge her to home tomorrow. Patient in agreement      TOTAL TIME TAKING CARE OF THIS PATIENT: 25 minutes.  >50% time spent on counselling and coordination of care  Note: This dictation was prepared with Dragon dictation along with smaller phrase technology. Any transcriptional errors that result from this process are unintentional.  Fritzi Mandes M.D    [facility]   CC: Primary care physician; Darrick Huntsman, MD Patient ID: Monica Cobb, female   DOB: August 26, 1955, 65 y.o.   MRN: [id number]

## 2020-12-11 NOTE — ED Notes (Signed)
Dr. Sidney Ace: "Would rather hold off in case Dr Posey Pronto wants to obtain C diff Pass to am nurse to tell Dr Posey Pronto"

## 2020-12-12 MED ORDER — AMOXICILLIN-POT CLAVULANATE 500-125 MG PO TABS: 1.0000 | ORAL_TABLET | Freq: Two times a day (BID) | ORAL | 0 refills | Status: AC

## 2020-12-12 NOTE — Plan of Care (Signed)
  Problem: Education: Goal: Knowledge of General Education information will improve Description: Including pain rating scale, medication(s)/side effects and non-pharmacologic comfort measures Outcome: Adequate for Discharge   Problem: Health Behavior/Discharge Planning: Goal: Ability to manage health-related needs will improve Outcome: Adequate for Discharge   Problem: Clinical Measurements: Goal: Ability to maintain clinical measurements within normal limits will improve Outcome: Adequate for Discharge Goal: Will remain free from infection Outcome: Adequate for Discharge Goal: Diagnostic test results will improve Outcome: Adequate for Discharge Goal: Respiratory complications will improve Outcome: Adequate for Discharge Goal: Cardiovascular complication will be avoided Outcome: Adequate for Discharge   Problem: Activity: Goal: Risk for activity intolerance will decrease Outcome: Adequate for Discharge   Problem: Nutrition: Goal: Adequate nutrition will be maintained Outcome: Adequate for Discharge   Problem: Coping: Goal: Level of anxiety will decrease Outcome: Adequate for Discharge   Problem: Elimination: Goal: Will not experience complications related to bowel motility Outcome: Adequate for Discharge Goal: Will not experience complications related to urinary retention Outcome: Adequate for Discharge   Problem: Pain Managment: Goal: General experience of comfort will improve Outcome: Adequate for Discharge   Problem: Safety: Goal: Ability to remain free from injury will improve Outcome: Adequate for Discharge   Problem: Skin Integrity: Goal: Risk for impaired skin integrity will decrease Outcome: Adequate for Discharge  Pt dc home   

## 2020-12-12 NOTE — Discharge Summary (Signed)
[facility] NAME: Monica Cobb    MR#:  [id number]  DATE OF BIRTH:  Sep 17, 1955  DATE OF ADMISSION:  12/09/2020 ADMITTING PHYSICIAN: Fritzi Mandes, MD  DATE OF DISCHARGE: 12/12/2020  PRIMARY CARE PHYSICIAN: Darrick Huntsman, MD    ADMISSION DIAGNOSIS:  Sepsis ([place]) [A41.9] Sepsis, due to unspecified organism, unspecified whether acute organ dysfunction present ([place]) [A41.9]  DISCHARGE DIAGNOSIS:  Sepsis due to Group A streptococcal pharyngitis  SECONDARY DIAGNOSIS:   Past Medical History:  Diagnosis Date  . Hypertension     HOSPITAL COURSE:  Monica Cobb is a 65 y.o. female with medical history significant for hypertension, anxiety, chronic pain, chronic low back pain status post lumbar fusion, hyperlipidemia, history of basal cell carcinoma at the right foot, who presents emergency department for chief concerns of throat pain and fever.  She reports bilateral shoulder and neck soreness started on 12/08/2020 and was a gradual onset. She feels like there is something stuck in her chest and throat..  She reports never feeling this way before.   Sepsis on presentation group a strep positive -- patient came in with high-grade fever, tachycardia, leukocytosis, positive group a strep throat -- elevated Pro calcitonin --- hemodynamically stable -- received IV fluids -- continue amoxicillin and cefepime--changed to po augmentin at discharge -- ER physician was unable to perform LP secondary to patient's lumbar fusion surgery on chronic back pain. -- Discussed with patient and  she is not keen on getting LP done. She feels better than yesterday. -- Blood culture so far negative -- sepsis resolved -- continue Augmentin for group a strep.   Leukocytosis -- came in with white count of 24.2-- 22.9--19 -- afebrile   COPD with chronic chest x-ray changes -- sats 93 to 97% on room air   hyponatremia with AK I in the setting of  sepsis -- came in with creatinine of 1.03-- .9 -- 129 sodium--- improved to 132--137   Jerrye Bushy -- continue PPI hyperlipidemia continue statins   chronic anxiety -- continue clonazepam   history of hypertension --Blood pressure soft -- patient tells me she does not take any blood pressure meds at home  Overall stable. Pt feels back to almost her baseline. D/c home. Pt agreeable   CONSULTS OBTAINED:    DRUG ALLERGIES:  No Known Allergies  DISCHARGE MEDICATIONS:   Allergies as of 12/12/2020   No Known Allergies      Medication List     STOP taking these medications    valsartan 80 MG tablet Commonly known as: DIOVAN       TAKE these medications    AeroChamber MV inhaler Use as instructed   albuterol 108 (90 Base) MCG/ACT inhaler Commonly known as: VENTOLIN HFA Inhale 2 puffs into the lungs every 4 (four) hours as needed for wheezing or shortness of breath.   amitriptyline 100 MG tablet Commonly known as: ELAVIL Take 100 mg by mouth at bedtime.   amoxicillin-clavulanate 500-125 MG tablet Commonly known as: AUGMENTIN Take 1 tablet (500 mg total) by mouth 2 (two) times daily for 15 doses.   atorvastatin 20 MG tablet Commonly known as: LIPITOR Take by mouth.   benzonatate 200 MG capsule Commonly known as: TESSALON Take 1 capsule (200 mg total) by mouth 3 (three) times daily as needed for cough.   clonazePAM 1 MG tablet Commonly known as: KLONOPIN Take 1 mg by mouth 2 (two) times daily as needed.   dicyclomine 10 MG capsule  Commonly known as: BENTYL TAKE 1 CAPSULE (10 MG TOTAL) BY MOUTH 4 (FOUR) TIMES A DAY AS NEEDED (ABDOMINAL PAIN/IBS)   estradiol 0.05 mg/24hr patch Commonly known as: CLIMARA - Dosed in mg/24 hr APPLY 1 PATCH ONE TIME PER WEEK   furosemide 20 MG tablet Commonly known as: LASIX Take 20 mg by mouth daily.   Hyoscyamine Sulfate SL 0.125 MG Subl Place 1 strip under the tongue daily as needed.   naproxen 500 MG tablet Commonly  known as: NAPROSYN Take 500 mg by mouth 2 (two) times daily with a meal.   pantoprazole 40 MG tablet Commonly known as: PROTONIX Take 40 mg by mouth 2 (two) times daily.   promethazine-dextromethorphan 6.25-15 MG/5ML syrup Commonly known as: PROMETHAZINE-DM Take 5 mLs by mouth 4 (four) times daily as needed.   tiZANidine 4 MG tablet Commonly known as: ZANAFLEX Take 4 mg by mouth at bedtime as needed.   traMADol 50 MG tablet Commonly known as: ULTRAM Take 1-2 tablets (50-100 mg total) by mouth every 12 (twelve) hours as needed.        If you experience worsening of your admission symptoms, develop shortness of breath, life threatening emergency, suicidal or homicidal thoughts you must seek medical attention immediately by calling 911 or calling your MD immediately  if symptoms less severe.  You Must read complete instructions/literature along with all the possible adverse reactions/side effects for all the Medicines you take and that have been prescribed to you. Take any new Medicines after you have completely understood and accept all the possible adverse reactions/side effects.   Please note  You were cared for by a hospitalist during your hospital stay. If you have any questions about your discharge medications or the care you received while you were in the hospital after you are discharged, you can call the unit and asked to speak with the hospitalist on call if the hospitalist that took care of you is not available. Once you are discharged, your primary care physician will handle any further medical issues. Please note that NO REFILLS for any discharge medications will be authorized once you are discharged, as it is imperative that you return to your primary care physician (or establish a relationship with a primary care physician if you do not have one) for your aftercare needs so that they can reassess your need for medications and monitor your lab values. Today   SUBJECTIVE    Doing well. Can I go home today? Eating ok No fever  VITAL SIGNS:  Blood pressure 132/86, pulse 82, temperature 98.2 F (36.8 C), resp. rate 18, height 5\' 3"  (1.6 m), weight 66.5 kg, SpO2 96 %.  I/O:   Intake/Output Summary (Last 24 hours) at 12/12/2020 0856 Last data filed at 12/12/2020 0713 Gross per 24 hour  Intake 3546.71 ml  Output --  Net 3546.71 ml    PHYSICAL EXAMINATION:  GENERAL:  65 y.o.-year-old patient lying in the bed with no acute distress.  LUNGS: Normal breath sounds bilaterally, no wheezing, rales,rhonchi or crepitation. No use of accessory muscles of respiration.  CARDIOVASCULAR: S1, S2 normal. No murmurs, rubs, or gallops.  ABDOMEN: Soft, non-tender, non-distended. Bowel sounds present. No organomegaly or mass.  EXTREMITIES: No pedal edema, cyanosis, or clubbing.  NEUROLOGIC: non-focal PSYCHIATRIC:  patient is alert and oriented x 3.  SKIN: No obvious rash, lesion, or ulcer.   DATA REVIEW:   CBC  Recent Labs  Lab 12/11/20 0847  WBC 19.1*  HGB 9.0*  HCT 27.0*  PLT 205    Chemistries  Recent Labs  Lab 12/09/20 1944 12/10/20 0720 12/11/20 0847  NA 129* 132* 137  K 3.9 3.0* 3.1*  CL 94* 102 108  CO2 25 23 23   GLUCOSE 121* 120* 116*  BUN 13 14 10   CREATININE 1.03* 0.97 0.69  CALCIUM 8.7* 8.0* 8.4*  MG  --  1.8  --   AST 41  --   --   ALT 51*  --   --   ALKPHOS 105  --   --   BILITOT 0.7  --   --     Microbiology Results   Recent Results (from the past 240 hour(s))  Resp Panel by RT-PCR (Flu A&B, Covid) Nasopharyngeal Swab     Status: None   Collection Time: 12/09/20 10:06 PM   Specimen: Nasopharyngeal Swab; Nasopharyngeal(NP) swabs in vial transport medium  Result Value Ref Range Status   SARS Coronavirus 2 by RT PCR NEGATIVE NEGATIVE Final    Comment: (NOTE) SARS-CoV-2 target nucleic acids are NOT DETECTED.  The SARS-CoV-2 RNA is generally detectable in upper respiratory specimens during the acute phase of infection. The  lowest concentration of SARS-CoV-2 viral copies this assay can detect is 138 copies/mL. A negative result does not preclude SARS-Cov-2 infection and should not be used as the sole basis for treatment or other patient management decisions. A negative result may occur with  improper specimen collection/handling, submission of specimen other than nasopharyngeal swab, presence of viral mutation(s) within the areas targeted by this assay, and inadequate number of viral copies(<138 copies/mL). A negative result must be combined with clinical observations, patient history, and epidemiological information. The expected result is Negative.  Fact Sheet for Patients:  EntrepreneurPulse.com.au  Fact Sheet for Healthcare Providers:  IncredibleEmployment.be  This test is no t yet approved or cleared by the [place] FDA and  has been authorized for detection and/or diagnosis of SARS-CoV-2 by FDA under an Emergency Use Authorization (EUA). This EUA will remain  in effect (meaning this test can be used) for the duration of the COVID-19 declaration under Section 564(b)(1) of the Act, 21 U.S.C.section 360bbb-3(b)(1), unless the authorization is terminated  or revoked sooner.       Influenza A by PCR NEGATIVE NEGATIVE Final   Influenza B by PCR NEGATIVE NEGATIVE Final    Comment: (NOTE) The Xpert Xpress SARS-CoV-2/FLU/RSV plus assay is intended as an aid in the diagnosis of influenza from Nasopharyngeal swab specimens and should not be used as a sole basis for treatment. Nasal washings and aspirates are unacceptable for Xpert Xpress SARS-CoV-2/FLU/RSV testing.  Fact Sheet for Patients: EntrepreneurPulse.com.au  Fact Sheet for Healthcare Providers: IncredibleEmployment.be  This test is not yet approved or cleared by the [place] FDA and has been authorized for detection and/or diagnosis of SARS-CoV-2 by FDA under  an Emergency Use Authorization (EUA). This EUA will remain in effect (meaning this test can be used) for the duration of the COVID-19 declaration under Section 564(b)(1) of the Act, 21 U.S.C. section 360bbb-3(b)(1), unless the authorization is terminated or revoked.  Performed at [facility], [address] [place], [place] [address]   Blood Culture (routine x 2)     Status: None (Preliminary result)   Collection Time: 12/09/20 10:06 PM   Specimen: BLOOD  Result Value Ref Range Status   Specimen Description BLOOD RAC  Final   Special Requests   Final    BOTTLES DRAWN AEROBIC AND ANAEROBIC Blood Culture adequate volume  Culture   Final    NO GROWTH 2 DAYS Performed at [facility] [place] [facility], [address] [place], [place] [address]    Report Status PENDING  Incomplete  Blood Culture (routine x 2)     Status: None (Preliminary result)   Collection Time: 12/09/20 10:06 PM   Specimen: BLOOD  Result Value Ref Range Status   Specimen Description BLOOD LARM  Final   Special Requests   Final    BOTTLES DRAWN AEROBIC AND ANAEROBIC Blood Culture results may not be optimal due to an inadequate volume of blood received in culture bottles   Culture   Final    NO GROWTH 2 DAYS Performed at [facility], [address], [place], [place] [address]    Report Status PENDING  Incomplete  Urine Culture     Status: None   Collection Time: 12/09/20 10:30 PM   Specimen: Urine, Random  Result Value Ref Range Status   Specimen Description   Final    URINE, RANDOM Performed at [facility], [address], [place], [place] [address]    Special Requests   Final    NONE Performed at [facility], [address], [place], [place] [address]    Culture   Final    NO GROWTH Performed at [facility], [address], [place], [place] [address]    Report Status 12/11/2020 FINAL  Final  Group A Strep by PCR ([facility] [facility] Only)     Status: Abnormal    Collection Time: 12/09/20 10:41 PM   Specimen: Throat; Sterile Swab  Result Value Ref Range Status   Group A Strep by PCR DETECTED (A) NOT DETECTED Final    Comment: Performed at [facility], [address], [place], [place] [address]    RADIOLOGY:  No results found.   CODE STATUS:     Code Status Orders  (From admission, onward)           Start     Ordered   12/09/20 2324  Full code  Continuous        12/09/20 2325           Code Status History     This patient has a current code status but no historical code status.        TOTAL TIME TAKING CARE OF THIS PATIENT: 40 minutes.    Fritzi Mandes M.D  [facility]    CC: Primary care physician; Darrick Huntsman, MD

## 2020-12-14 LAB — CULTURE, BLOOD (ROUTINE X 2)
Culture: NO GROWTH
Culture: NO GROWTH
Special Requests: ADEQUATE

## 2020-12-20 ENCOUNTER — Other Ambulatory Visit: Payer: Self-pay | Admitting: Physician Assistant

## 2020-12-20 MED ORDER — DOCUSATE SODIUM 100 MG PO CAPS: 100.0000 mg | ORAL_CAPSULE | Freq: Every day | ORAL | 2 refills | Status: DC | PRN

## 2020-12-20 MED ORDER — ONDANSETRON HCL 4 MG PO TABS: 4.0000 mg | ORAL_TABLET | Freq: Three times a day (TID) | ORAL | 0 refills | Status: DC | PRN

## 2020-12-20 MED ORDER — METHOCARBAMOL 500 MG PO TABS: 500.0000 mg | ORAL_TABLET | Freq: Two times a day (BID) | ORAL | 2 refills | Status: DC | PRN

## 2020-12-20 MED ORDER — OXYCODONE-ACETAMINOPHEN 5-325 MG PO TABS
1.0000 | ORAL_TABLET | Freq: Four times a day (QID) | ORAL | 0 refills | Status: DC | PRN
Start: 2020-12-20 — End: 2020-12-30

## 2020-12-20 MED ORDER — ASPIRIN EC 81 MG PO TBEC: 81.0000 mg | DELAYED_RELEASE_TABLET | Freq: Two times a day (BID) | ORAL | 0 refills | Status: DC

## 2020-12-21 NOTE — Pre-Procedure Instructions (Signed)
Surgical Instructions    Your procedure is scheduled on Monday 12/26/20.   Report to [facility] Main Entrance "A" at 05:30 A.M., then check in with the Admitting office.  Call this number if you have problems the morning of surgery:  [phone]   If you have any questions prior to your surgery date call [phone]: Open Monday-Friday 8am-4pm    Remember:  Do not eat after midnight the night before your surgery  You may drink clear liquids until 04:15 the morning of your surgery.   Clear liquids allowed are: Water, Non-Citrus Juices (without pulp), Carbonated Beverages, Clear Tea, Black Coffee ONLY (NO MILK, CREAM OR POWDERED CREAMER of any kind), and Gatorade   Patient Instructions  The night before surgery:  No food after midnight. ONLY clear liquids after midnight  The day of surgery (if you do NOT have diabetes):  Drink ONE (1) Pre-Surgery Clear Ensure by 04:15 A.M. the morning of surgery. Drink in one sitting. Do not sip.  This drink was given to you during your hospital  pre-op appointment visit.  Nothing else to drink after completing the  Pre-Surgery Clear Ensure.         If you have questions, please contact your surgeon's office.   Take these medicines the morning of surgery with A SIP OF WATER   atorvastatin (LIPITOR)  pantoprazole (PROTONIX)    clonazePAM (KLONOPIN)- If needed dicyclomine (BENTYL)- If needed docusate sodium (COLACE)- If needed ondansetron (ZOFRAN)- If needed tiZANidine (ZANAFLEX)- If needed tramadol (ULTRAM)- If needed  As of today, STOP taking any Aspirin (unless otherwise instructed by your surgeon) Aleve, Naproxen, Ibuprofen, Motrin, Advil, Goody's, BC's, all herbal medications, fish oil, and all vitamins.     After your COVID test   You are not required to quarantine however you are required to wear a well-fitting mask when you are out and around people not in your household.  If your mask becomes wet or soiled, replace with a new  one.  Wash your hands often with soap and water for 20 seconds or clean your hands with an alcohol-based hand sanitizer that contains at least 60% alcohol.  Do not share personal items.  Notify your provider: if you are in close contact with someone who has COVID  or if you develop a fever of 100.4 or greater, sneezing, cough, sore throat, shortness of breath or body aches.             Do not wear jewelry or makeup Do not wear lotions, powders, perfumes/colognes, or deodorant. Do not shave 48 hours prior to surgery.  Men may shave face and neck. Do not bring valuables to the hospital. DO Not wear nail polish, gel polish, artificial nails, or any other type of covering on natural nails including finger and toenails. If patients have artificial nails, gel coating, etc. that need to be removed by a nail salon, please have this removed prior to surgery or surgery may need to be canceled/delayed if the surgeon/ anesthesia feels like the patient is unable to be adequately monitored.             [facility] is not responsible for any belongings or valuables.  Do NOT Smoke (Tobacco/Vaping)  24 hours prior to your procedure  If you use a CPAP at night, you may bring your mask for your overnight stay.   Contacts, glasses, hearing aids, dentures or partials may not be worn into surgery, please bring cases for these belongings   For  patients admitted to the hospital, discharge time will be determined by your treatment team.   Patients discharged the day of surgery will not be allowed to drive home, and someone needs to stay with them for 24 hours.  NO VISITORS WILL BE ALLOWED IN PRE-OP WHERE PATIENTS ARE PREPPED FOR SURGERY.  ONLY 1 SUPPORT PERSON MAY BE PRESENT IN THE WAITING ROOM WHILE YOU ARE IN SURGERY.  IF YOU ARE TO BE ADMITTED, ONCE YOU ARE IN YOUR ROOM YOU WILL BE ALLOWED TWO (2) VISITORS. 1 (ONE) VISITOR MAY STAY OVERNIGHT BUT MUST ARRIVE TO THE ROOM BY 8pm.  Minor children may have two  parents present. Special consideration for safety and communication needs will be reviewed on a case by case basis.  Special instructions:    Oral Hygiene is also important to reduce your risk of infection.  Remember - BRUSH YOUR TEETH THE MORNING OF SURGERY WITH YOUR REGULAR TOOTHPASTE   [place]- Preparing For Surgery  Before surgery, you can play an important role. Because skin is not sterile, your skin needs to be as free of germs as possible. You can reduce the number of germs on your skin by washing with CHG (chlorahexidine gluconate) Soap before surgery.  CHG is an antiseptic cleaner which kills germs and bonds with the skin to continue killing germs even after washing.     Please do not use if you have an allergy to CHG or antibacterial soaps. If your skin becomes reddened/irritated stop using the CHG.  Do not shave (including legs and underarms) for at least 48 hours prior to first CHG shower. It is OK to shave your face.  Please follow these instructions carefully.     Shower the NIGHT BEFORE SURGERY and the MORNING OF SURGERY with CHG Soap.   If you chose to wash your hair, wash your hair first as usual with your normal shampoo. After you shampoo, rinse your hair and body thoroughly to remove the shampoo.  Then [employer] and genitals (private parts) with your normal soap and rinse thoroughly to remove soap.  After that Use CHG Soap as you would any other liquid soap. You can apply CHG directly to the skin and wash gently with a scrungie or a clean washcloth.   Apply the CHG Soap to your body ONLY FROM THE NECK DOWN.  Do not use on open wounds or open sores. Avoid contact with your eyes, ears, mouth and genitals (private parts). Wash Face and genitals (private parts)  with your normal soap.   Wash thoroughly, paying special attention to the area where your surgery will be performed.  Thoroughly rinse your body with warm water from the neck down.  DO NOT shower/wash with your  normal soap after using and rinsing off the CHG Soap.  Pat yourself dry with a CLEAN TOWEL.  Wear CLEAN PAJAMAS to bed the night before surgery  Place CLEAN SHEETS on your bed the night before your surgery  DO NOT SLEEP WITH PETS.   Day of Surgery:  Take a shower with CHG soap. Wear Clean/Comfortable clothing the morning of surgery Do not apply any deodorants/lotions.   Remember to brush your teeth WITH YOUR REGULAR TOOTHPASTE.   Please read over the following fact sheets that you were given.

## 2020-12-22 ENCOUNTER — Encounter (HOSPITAL_COMMUNITY)
Admission: RE | Admit: 2020-12-22 | Discharge: 2020-12-22 | Disposition: A | Discharge status: Home / Self Care | Payer: 59 | Source: Ambulatory Visit | Attending: Orthopaedic Surgery | Admitting: Orthopaedic Surgery

## 2020-12-22 ENCOUNTER — Other Ambulatory Visit: Payer: Self-pay

## 2020-12-22 ENCOUNTER — Encounter (HOSPITAL_COMMUNITY): Payer: Self-pay

## 2020-12-22 VITALS — BP 151/63 | HR 92 | Temp 97.9°F | Resp 18 | Ht 63.0 in | Wt 141.6 lb

## 2020-12-22 DIAGNOSIS — J449 Chronic obstructive pulmonary disease, unspecified: Secondary | ICD-10-CM | POA: Insufficient documentation

## 2020-12-22 DIAGNOSIS — Z20822 Contact with and (suspected) exposure to covid-19: Secondary | ICD-10-CM | POA: Insufficient documentation

## 2020-12-22 DIAGNOSIS — M87051 Idiopathic aseptic necrosis of right femur: Secondary | ICD-10-CM | POA: Insufficient documentation

## 2020-12-22 DIAGNOSIS — Z87891 Personal history of nicotine dependence: Secondary | ICD-10-CM | POA: Diagnosis not present

## 2020-12-22 DIAGNOSIS — I2510 Atherosclerotic heart disease of native coronary artery without angina pectoris: Secondary | ICD-10-CM | POA: Diagnosis not present

## 2020-12-22 DIAGNOSIS — Z01818 Encounter for other preprocedural examination: Secondary | ICD-10-CM | POA: Insufficient documentation

## 2020-12-22 HISTORY — DX: Malignant (primary) neoplasm, unspecified: C80.1

## 2020-12-22 HISTORY — DX: Nausea with vomiting, unspecified: R11.2

## 2020-12-22 HISTORY — DX: Gastro-esophageal reflux disease without esophagitis: K21.9

## 2020-12-22 HISTORY — DX: Atherosclerotic heart disease of native coronary artery without angina pectoris: I25.10

## 2020-12-22 HISTORY — DX: Chronic kidney disease, unspecified: N18.9

## 2020-12-22 HISTORY — DX: Nausea with vomiting, unspecified: Z98.890

## 2020-12-22 LAB — COMPREHENSIVE METABOLIC PANEL
ALT: 20 U/L (ref 0–44)
AST: 15 U/L (ref 15–41)
Albumin: 3.4 g/dL — ABNORMAL LOW (ref 3.5–5.0)
Alkaline Phosphatase: 68 U/L (ref 38–126)
Anion gap: 9 (ref 5–15)
BUN: 9 mg/dL (ref 8–23)
CO2: 24 mmol/L (ref 22–32)
Calcium: 9 mg/dL (ref 8.9–10.3)
Chloride: 103 mmol/L (ref 98–111)
Creatinine, Ser: 0.86 mg/dL (ref 0.44–1.00)
GFR, Estimated: >60 mL/min (ref >60)
Glucose, Bld: 96 mg/dL (ref 70–99)
Potassium: 3.8 mmol/L (ref 3.5–5.1)
Sodium: 136 mmol/L (ref 135–145)
Total Bilirubin: 0.7 mg/dL (ref 0.3–1.2)
Total Protein: 7.2 g/dL (ref 6.5–8.1)

## 2020-12-22 LAB — CBC WITH DIFFERENTIAL/PLATELET
Abs Immature Granulocytes: 0.04 10*3/uL (ref 0.00–0.07)
Basophils Absolute: 0 10*3/uL (ref 0.0–0.1)
Basophils Relative: 0 %
Eosinophils Absolute: 0.1 10*3/uL (ref 0.0–0.5)
Eosinophils Relative: 1 %
HCT: 34.3 % — ABNORMAL LOW (ref 36.0–46.0)
Hemoglobin: 11.1 g/dL — ABNORMAL LOW (ref 12.0–15.0)
Immature Granulocytes: 0 %
Lymphocytes Relative: 33 %
Lymphs Abs: 3 10*3/uL (ref 0.7–4.0)
MCH: 29.8 pg (ref 26.0–34.0)
MCHC: 32.4 g/dL (ref 30.0–36.0)
MCV: 92 fL (ref 80.0–100.0)
Monocytes Absolute: 0.6 10*3/uL (ref 0.1–1.0)
Monocytes Relative: 6 %
Neutro Abs: 5.3 10*3/uL (ref 1.7–7.7)
Neutrophils Relative %: 60 %
Platelets: 375 10*3/uL (ref 150–400)
RBC: 3.73 MIL/uL — ABNORMAL LOW (ref 3.87–5.11)
RDW: 16.7 % — ABNORMAL HIGH (ref 11.5–15.5)
WBC: 9 10*3/uL (ref 4.0–10.5)
nRBC: 0 % (ref 0.0–0.2)

## 2020-12-22 LAB — SURGICAL PCR SCREEN
MRSA, PCR: NEGATIVE
Staphylococcus aureus: NEGATIVE

## 2020-12-22 LAB — PROTIME-INR
INR: 1.1 (ref 0.8–1.2)
Prothrombin Time: 14.4 seconds (ref 11.4–15.2)

## 2020-12-22 LAB — URINALYSIS, ROUTINE W REFLEX MICROSCOPIC
Bilirubin Urine: NEGATIVE
Glucose, UA: NEGATIVE mg/dL
Hgb urine dipstick: NEGATIVE
Ketones, ur: NEGATIVE mg/dL
Leukocytes,Ua: NEGATIVE
Nitrite: NEGATIVE
Protein, ur: NEGATIVE mg/dL
Specific Gravity, Urine: 1.01 (ref 1.005–1.030)
pH: 6.5 (ref 5.0–8.0)

## 2020-12-22 LAB — APTT: aPTT: 31 seconds (ref 24–36)

## 2020-12-22 LAB — SARS CORONAVIRUS 2 (TAT 6-24 HRS): SARS Coronavirus 2: NEGATIVE

## 2020-12-22 NOTE — Progress Notes (Signed)
PCP - Bernita Raisin [facility] Primary care in [place]) Cardiologist - denies Nephrologist: [facility])  PPM/ICD - denies   Chest x-ray - n/a EKG - 12/22/20 Stress Test - 05/03/16 ECHO - 03/02/16 Cardiac Cath - over 10 years ago, normal per patient  Sleep Study - denies  No diabetes  As of today, STOP taking any Aspirin (unless otherwise instructed by your surgeon) Aleve, Naproxen, Ibuprofen, Motrin, Advil, Goody's, BC's, all herbal medications, fish oil, and all vitamins.  ERAS Protcol -yes PRE-SURGERY Ensure or G2- ensure ordered and given  COVID TEST- 12/22/20 in PAT   Anesthesia review: yes, cardiac history  Patient denies shortness of breath, fever, cough and chest pain at PAT appointment   All instructions explained to the patient, with a verbal understanding of the material. Patient agrees to go over the instructions while at home for a better understanding. Patient also instructed to self quarantine after being tested for COVID-19. The opportunity to ask questions was provided.

## 2020-12-23 ENCOUNTER — Other Ambulatory Visit: Payer: Self-pay | Admitting: Orthopaedic Surgery

## 2020-12-23 ENCOUNTER — Telehealth: Payer: Self-pay | Admitting: Orthopaedic Surgery

## 2020-12-23 MED ORDER — TRANEXAMIC ACID 1000 MG/10ML IV SOLN
2000.0000 mg | INTRAVENOUS | Status: DC
  Filled 2020-12-23: qty 20

## 2020-12-23 NOTE — Anesthesia Preprocedure Evaluation (Addendum)
Anesthesia Evaluation  Patient identified by MRN, date of birth, ID band Patient awake    Reviewed: Allergy & Precautions, NPO status , Patient's Chart, lab work & pertinent test results  History of Anesthesia Complications (+) PONV and history of anesthetic complications  Airway Mallampati: I  TM Distance: >3 FB Neck ROM: Full    Dental  (+) Edentulous Upper, Edentulous Lower   Pulmonary former smoker,  Quit smoking 68moago, 1/2 ppd previously for many years No inhalers   Pulmonary exam normal breath sounds clear to auscultation       Cardiovascular hypertension, + CAD  Normal cardiovascular exam Rhythm:Regular Rate:Normal     Neuro/Psych PSYCHIATRIC DISORDERS Anxiety negative neurological ROS     GI/Hepatic Neg liver ROS, GERD  Medicated and Controlled,  Endo/Other  negative endocrine ROS  Renal/GU negative Renal ROS  negative genitourinary   Musculoskeletal R hip AVN   Abdominal   Peds  Hematology negative hematology ROS (+) hct 34.3, plt 375   Anesthesia Other Findings [facility]2020 for sepsis secondary to group A strep. Blood cultures negative. Improved with antibiotics and IV fluids.  Discharged on Augmentin, which she has completed. Reports complete recovery, feeling well.  Reproductive/Obstetrics negative OB ROS                           Anesthesia Physical Anesthesia Plan  ASA: 2  Anesthesia Plan: MAC and Spinal   Post-op Pain Management: Tylenol PO (pre-op) and Toradol IV (intra-op)   Induction:   PONV Risk Score and Plan: 2 and Propofol infusion and TIVA  Airway Management Planned: Natural Airway and Simple Face Mask  Additional Equipment: None  Intra-op Plan:   Post-operative Plan:   Informed Consent: I have reviewed the patients History and Physical, chart, labs and discussed the procedure including the risks, benefits and  alternatives for the proposed anesthesia with the patient or authorized representative who has indicated his/her understanding and acceptance.       Plan Discussed with: CRNA  Anesthesia Plan Comments: ( )      Anesthesia Quick Evaluation

## 2020-12-23 NOTE — Telephone Encounter (Signed)
Spoke to patient and she completed augmentin earlier this week and feels completely fine.  Blood cultures were negative.

## 2020-12-23 NOTE — Telephone Encounter (Signed)
Patient is scheduled for [facility] TOTAL HIP at [facility] Main Monday 12-26-20 at 7:15am.  Jeneen Rinks from short stay called wanting to let you know patient was admitted to the hospital on 12-09-20 for sepsis, secondary to strep (group A) and discharged 12-12-20.  Patient was prescribed amoxicillin-clavulanate (AUGMENTIN) 500-125 MG tablet and instructed to take 1 tablet (500 mg total) by mouth 2 (two) times daily for 15 doses.Patient was seen by Dr. Erlinda Hong and scheduled 11-22-20 for this procedure prior to having been admitted.  Discharge summery is in [place].  This is not an issue from an anesthesia standpoint.  Jeneen Rinks just providing you with Juluis Rainier so you are aware should there be any guidelines from an orthopedic standpoint associated with infection and recovery.

## 2020-12-23 NOTE — Progress Notes (Signed)
Anesthesia Chart Review:  History of coronary atherosclerosis seen on chest CT 2018, subsequently had nuclear stress 05/03/2016 at [facility] which was nonischemic.  Echo 03/02/2016 showed normal biventricular function, normal valves.  Former smoker, history of COPD, maintained on as needed albuterol.  Recent admission at [facility] 12/09/2020 through 12/12/2020 for sepsis secondary to group A strep. Blood cultures negative. Improved with antibiotics and IV fluids.  Discharged on Augmentin, which she has completed. Reports complete recovery, feeling well.  Preop labs reviewed, mild anemia with hemoglobin 11.1, otherwise unremarkable.  EKG 12/22/20: NSR. Rate 72.  Nuclear stress 05/03/2016 (Care Everywhere): Impressions:  - Normal myocardial perfusion study  - No evidence for significant ischemia or scar is noted.  - Post stress: Global systolic function is hyperdynamic. The ejection  fraction was greater than 65%. Left ventricular chamber size is relatively  small.  - Coronary calcifications are noted   TTE 03/02/2016 (Care Everywhere): Conclusion:  Normal left ventricular systolic function, ejection fraction 60 to 65%   Normal right ventricular systolic function    Wynonia Musty [facility] Short Stay Center/Anesthesiology Phone [phone] 12/23/2020 9:35 AM

## 2020-12-26 ENCOUNTER — Observation Stay (HOSPITAL_COMMUNITY): Payer: 59

## 2020-12-26 ENCOUNTER — Ambulatory Visit (HOSPITAL_COMMUNITY): Payer: 59

## 2020-12-26 ENCOUNTER — Ambulatory Visit (HOSPITAL_COMMUNITY): Payer: 59 | Admitting: Anesthesiology

## 2020-12-26 ENCOUNTER — Ambulatory Visit (HOSPITAL_COMMUNITY): Payer: 59 | Admitting: Physician Assistant

## 2020-12-26 ENCOUNTER — Observation Stay (HOSPITAL_COMMUNITY)
Admission: RE | Admit: 2020-12-26 | Discharge: 2020-12-27 | Disposition: A | Discharge status: Home / Self Care | Payer: 59 | Source: Ambulatory Visit | Attending: Orthopaedic Surgery | Admitting: Orthopaedic Surgery

## 2020-12-26 ENCOUNTER — Other Ambulatory Visit: Payer: Self-pay

## 2020-12-26 ENCOUNTER — Encounter (HOSPITAL_COMMUNITY): Payer: Self-pay | Admitting: Orthopaedic Surgery

## 2020-12-26 ENCOUNTER — Encounter (HOSPITAL_COMMUNITY)
Admission: RE | Disposition: A | Discharge status: Home / Self Care | Payer: Self-pay | Source: Ambulatory Visit | Attending: Orthopaedic Surgery

## 2020-12-26 DIAGNOSIS — I2510 Atherosclerotic heart disease of native coronary artery without angina pectoris: Secondary | ICD-10-CM | POA: Diagnosis not present

## 2020-12-26 DIAGNOSIS — Z96649 Presence of unspecified artificial hip joint: Secondary | ICD-10-CM

## 2020-12-26 DIAGNOSIS — Z79899 Other long term (current) drug therapy: Secondary | ICD-10-CM | POA: Insufficient documentation

## 2020-12-26 DIAGNOSIS — N183 Chronic kidney disease, stage 3 unspecified: Secondary | ICD-10-CM | POA: Diagnosis not present

## 2020-12-26 DIAGNOSIS — Z7982 Long term (current) use of aspirin: Secondary | ICD-10-CM | POA: Diagnosis not present

## 2020-12-26 DIAGNOSIS — Z87891 Personal history of nicotine dependence: Secondary | ICD-10-CM | POA: Diagnosis not present

## 2020-12-26 DIAGNOSIS — I129 Hypertensive chronic kidney disease with stage 1 through stage 4 chronic kidney disease, or unspecified chronic kidney disease: Secondary | ICD-10-CM | POA: Insufficient documentation

## 2020-12-26 DIAGNOSIS — M87051 Idiopathic aseptic necrosis of right femur: Principal | ICD-10-CM | POA: Insufficient documentation

## 2020-12-26 DIAGNOSIS — Z96641 Presence of right artificial hip joint: Secondary | ICD-10-CM

## 2020-12-26 DIAGNOSIS — Z96642 Presence of left artificial hip joint: Secondary | ICD-10-CM | POA: Diagnosis not present

## 2020-12-26 DIAGNOSIS — M87021 Idiopathic aseptic necrosis of right humerus: Secondary | ICD-10-CM | POA: Insufficient documentation

## 2020-12-26 DIAGNOSIS — Z419 Encounter for procedure for purposes other than remedying health state, unspecified: Secondary | ICD-10-CM

## 2020-12-26 HISTORY — PX: TOTAL HIP ARTHROPLASTY: SHX124

## 2020-12-26 LAB — ABO/RH: ABO/RH(D): A POS

## 2020-12-26 SURGERY — ARTHROPLASTY, HIP, TOTAL, ANTERIOR APPROACH
Anesthesia: Monitor Anesthesia Care | Site: Hip | Laterality: Right

## 2020-12-26 MED ORDER — ALUM & MAG HYDROXIDE-SIMETH 200-200-20 MG/5ML PO SUSP: 30.0000 mL | ORAL | Status: DC | PRN

## 2020-12-26 MED ORDER — VANCOMYCIN HCL 1 G IV SOLR
INTRAVENOUS | Status: DC | PRN
  Administered 2020-12-26: 1000 mg

## 2020-12-26 MED ORDER — PROMETHAZINE HCL 25 MG/ML IJ SOLN: 6.2500 mg | INTRAMUSCULAR | Status: DC | PRN

## 2020-12-26 MED ORDER — POVIDONE-IODINE 10 % EX SWAB
2.0000 "application " | Freq: Once | CUTANEOUS | Status: AC
  Administered 2020-12-26: 2 via TOPICAL

## 2020-12-26 MED ORDER — HYDROMORPHONE HCL 1 MG/ML IJ SOLN
0.2500 mg | INTRAMUSCULAR | Status: DC | PRN
Start: 2020-12-26 — End: 2020-12-26

## 2020-12-26 MED ORDER — DOCUSATE SODIUM 100 MG PO CAPS
100.0000 mg | ORAL_CAPSULE | Freq: Two times a day (BID) | ORAL | Status: DC
  Administered 2020-12-26 – 2020-12-27 (×3): 100 mg via ORAL
  Filled 2020-12-26 (×3): qty 1

## 2020-12-26 MED ORDER — SORBITOL 70 % SOLN: 30.0000 mL | Freq: Every day | Status: DC | PRN

## 2020-12-26 MED ORDER — METHOCARBAMOL 1000 MG/10ML IJ SOLN
500.0000 mg | Freq: Four times a day (QID) | INTRAVENOUS | Status: DC | PRN
  Filled 2020-12-26: qty 5

## 2020-12-26 MED ORDER — DIPHENHYDRAMINE HCL 12.5 MG/5ML PO ELIX
25.0000 mg | ORAL_SOLUTION | ORAL | Status: DC | PRN
  Filled 2020-12-26: qty 10

## 2020-12-26 MED ORDER — OXYCODONE HCL 5 MG/5ML PO SOLN: 5.0000 mg | Freq: Once | ORAL | Status: AC | PRN

## 2020-12-26 MED ORDER — METOCLOPRAMIDE HCL 5 MG/ML IJ SOLN: 5.0000 mg | Freq: Three times a day (TID) | INTRAMUSCULAR | Status: DC | PRN

## 2020-12-26 MED ORDER — CHLORHEXIDINE GLUCONATE 0.12 % MT SOLN
15.0000 mL | Freq: Once | OROMUCOSAL | Status: AC
  Administered 2020-12-26: 15 mL via OROMUCOSAL
  Filled 2020-12-26: qty 15

## 2020-12-26 MED ORDER — CEFAZOLIN SODIUM-DEXTROSE 2-4 GM/100ML-% IV SOLN
2.0000 g | Freq: Four times a day (QID) | INTRAVENOUS | Status: AC
  Administered 2020-12-26 (×2): 2 g via INTRAVENOUS
  Filled 2020-12-26 (×2): qty 100

## 2020-12-26 MED ORDER — ONDANSETRON HCL 4 MG PO TABS: 4.0000 mg | ORAL_TABLET | Freq: Four times a day (QID) | ORAL | Status: DC | PRN

## 2020-12-26 MED ORDER — SODIUM CHLORIDE 0.9 % IR SOLN
Status: DC | PRN
  Administered 2020-12-26: 1000 mL

## 2020-12-26 MED ORDER — CLONAZEPAM 0.5 MG PO TABS: 1.0000 mg | ORAL_TABLET | Freq: Two times a day (BID) | ORAL | Status: DC | PRN

## 2020-12-26 MED ORDER — OXYCODONE HCL 5 MG PO TABS
ORAL_TABLET | ORAL | Status: AC
  Filled 2020-12-26: qty 1

## 2020-12-26 MED ORDER — TRANEXAMIC ACID-NACL 1000-0.7 MG/100ML-% IV SOLN
INTRAVENOUS | Status: AC
  Filled 2020-12-26: qty 100

## 2020-12-26 MED ORDER — MENTHOL 3 MG MT LOZG: 1.0000 | LOZENGE | OROMUCOSAL | Status: DC | PRN

## 2020-12-26 MED ORDER — PHENOL 1.4 % MT LIQD: 1.0000 | OROMUCOSAL | Status: DC | PRN

## 2020-12-26 MED ORDER — AMITRIPTYLINE HCL 50 MG PO TABS
100.0000 mg | ORAL_TABLET | Freq: Every day | ORAL | Status: DC
  Administered 2020-12-26: 100 mg via ORAL
  Filled 2020-12-26 (×2): qty 1
  Filled 2020-12-26 (×2): qty 2

## 2020-12-26 MED ORDER — ASPIRIN 81 MG PO CHEW
81.0000 mg | CHEWABLE_TABLET | Freq: Two times a day (BID) | ORAL | Status: DC
  Administered 2020-12-26 – 2020-12-27 (×2): 81 mg via ORAL
  Filled 2020-12-26 (×2): qty 1

## 2020-12-26 MED ORDER — DEXAMETHASONE SODIUM PHOSPHATE 10 MG/ML IJ SOLN
10.0000 mg | Freq: Once | INTRAMUSCULAR | Status: AC
  Administered 2020-12-27: 10 mg via INTRAVENOUS
  Filled 2020-12-26: qty 1

## 2020-12-26 MED ORDER — OXYCODONE HCL ER 10 MG PO T12A
10.0000 mg | EXTENDED_RELEASE_TABLET | Freq: Two times a day (BID) | ORAL | Status: DC
  Administered 2020-12-26 – 2020-12-27 (×3): 10 mg via ORAL
  Filled 2020-12-26 (×3): qty 1

## 2020-12-26 MED ORDER — FENTANYL CITRATE (PF) 250 MCG/5ML IJ SOLN
INTRAMUSCULAR | Status: DC | PRN
  Administered 2020-12-26 (×3): 50 ug via INTRAVENOUS

## 2020-12-26 MED ORDER — PANTOPRAZOLE SODIUM 40 MG PO TBEC
40.0000 mg | DELAYED_RELEASE_TABLET | Freq: Every day | ORAL | Status: DC
  Administered 2020-12-26 – 2020-12-27 (×2): 40 mg via ORAL
  Filled 2020-12-26 (×2): qty 1

## 2020-12-26 MED ORDER — PROPOFOL 10 MG/ML IV BOLUS
INTRAVENOUS | Status: DC | PRN
  Administered 2020-12-26: 80 ug/kg/min via INTRAVENOUS
  Administered 2020-12-26: 35 mg via INTRAVENOUS

## 2020-12-26 MED ORDER — ONDANSETRON HCL 4 MG/2ML IJ SOLN: 4.0000 mg | Freq: Four times a day (QID) | INTRAMUSCULAR | Status: DC | PRN

## 2020-12-26 MED ORDER — LACTATED RINGERS IV SOLN: INTRAVENOUS | Status: DC

## 2020-12-26 MED ORDER — FENTANYL CITRATE (PF) 250 MCG/5ML IJ SOLN
INTRAMUSCULAR | Status: AC
  Filled 2020-12-26: qty 5

## 2020-12-26 MED ORDER — BUPIVACAINE-MELOXICAM ER 400-12 MG/14ML IJ SOLN
INTRAMUSCULAR | Status: AC
  Filled 2020-12-26: qty 1

## 2020-12-26 MED ORDER — TRANEXAMIC ACID-NACL 1000-0.7 MG/100ML-% IV SOLN
1000.0000 mg | INTRAVENOUS | Status: AC
  Administered 2020-12-26: 1000 mg via INTRAVENOUS

## 2020-12-26 MED ORDER — ONDANSETRON HCL 4 MG/2ML IJ SOLN
INTRAMUSCULAR | Status: AC
  Filled 2020-12-26: qty 2

## 2020-12-26 MED ORDER — CEFAZOLIN SODIUM-DEXTROSE 2-4 GM/100ML-% IV SOLN
2.0000 g | INTRAVENOUS | Status: AC
  Administered 2020-12-26: 2 g via INTRAVENOUS

## 2020-12-26 MED ORDER — TRANEXAMIC ACID-NACL 1000-0.7 MG/100ML-% IV SOLN
1000.0000 mg | Freq: Once | INTRAVENOUS | Status: AC
  Administered 2020-12-26: 1000 mg via INTRAVENOUS
  Filled 2020-12-26: qty 100

## 2020-12-26 MED ORDER — AMISULPRIDE (ANTIEMETIC) 5 MG/2ML IV SOLN: 10.0000 mg | Freq: Once | INTRAVENOUS | Status: DC | PRN

## 2020-12-26 MED ORDER — SODIUM CHLORIDE 0.9 % IV SOLN: INTRAVENOUS | Status: DC

## 2020-12-26 MED ORDER — METOCLOPRAMIDE HCL 5 MG PO TABS: 5.0000 mg | ORAL_TABLET | Freq: Three times a day (TID) | ORAL | Status: DC | PRN

## 2020-12-26 MED ORDER — PHENYLEPHRINE HCL-NACL 20-0.9 MG/250ML-% IV SOLN
INTRAVENOUS | Status: DC | PRN
  Administered 2020-12-26: 40 ug/min via INTRAVENOUS

## 2020-12-26 MED ORDER — METHOCARBAMOL 500 MG PO TABS
500.0000 mg | ORAL_TABLET | Freq: Four times a day (QID) | ORAL | Status: DC | PRN
  Administered 2020-12-26 – 2020-12-27 (×3): 500 mg via ORAL
  Filled 2020-12-26 (×3): qty 1

## 2020-12-26 MED ORDER — OXYCODONE HCL 5 MG PO TABS
5.0000 mg | ORAL_TABLET | Freq: Once | ORAL | Status: AC | PRN
  Administered 2020-12-26: 5 mg via ORAL

## 2020-12-26 MED ORDER — LACTATED RINGERS IV SOLN: INTRAVENOUS | Status: DC | PRN

## 2020-12-26 MED ORDER — BUPIVACAINE-MELOXICAM ER 400-12 MG/14ML IJ SOLN
INTRAMUSCULAR | Status: DC | PRN
  Administered 2020-12-26: 400 mg

## 2020-12-26 MED ORDER — IRRISEPT - 450ML BOTTLE WITH 0.05% CHG IN STERILE WATER, USP 99.95% OPTIME
TOPICAL | Status: DC | PRN
  Administered 2020-12-26: 450 mL

## 2020-12-26 MED ORDER — ACETAMINOPHEN 500 MG PO TABS
1000.0000 mg | ORAL_TABLET | Freq: Once | ORAL | Status: AC
  Administered 2020-12-26: 1000 mg via ORAL
  Filled 2020-12-26: qty 2

## 2020-12-26 MED ORDER — VANCOMYCIN HCL 1000 MG IV SOLR
INTRAVENOUS | Status: AC
  Filled 2020-12-26: qty 20

## 2020-12-26 MED ORDER — ORAL CARE MOUTH RINSE: 15.0000 mL | Freq: Once | OROMUCOSAL | Status: AC

## 2020-12-26 MED ORDER — PROPOFOL 10 MG/ML IV BOLUS
INTRAVENOUS | Status: AC
  Filled 2020-12-26: qty 20

## 2020-12-26 MED ORDER — MIDAZOLAM HCL 2 MG/2ML IJ SOLN
INTRAMUSCULAR | Status: DC | PRN
  Administered 2020-12-26: 2 mg via INTRAVENOUS

## 2020-12-26 MED ORDER — 0.9 % SODIUM CHLORIDE (POUR BTL) OPTIME
TOPICAL | Status: DC | PRN
  Administered 2020-12-26: 1000 mL

## 2020-12-26 MED ORDER — ALBUMIN HUMAN 5 % IV SOLN: INTRAVENOUS | Status: DC | PRN

## 2020-12-26 MED ORDER — POLYETHYLENE GLYCOL 3350 17 G PO PACK
17.0000 g | PACK | Freq: Every day | ORAL | Status: DC
  Administered 2020-12-26: 17 g via ORAL
  Filled 2020-12-26: qty 1

## 2020-12-26 MED ORDER — PHENYLEPHRINE 40 MCG/ML (10ML) SYRINGE FOR IV PUSH (FOR BLOOD PRESSURE SUPPORT)
PREFILLED_SYRINGE | INTRAVENOUS | Status: AC
  Filled 2020-12-26: qty 10

## 2020-12-26 MED ORDER — CEFAZOLIN SODIUM-DEXTROSE 2-4 GM/100ML-% IV SOLN
INTRAVENOUS | Status: AC
  Filled 2020-12-26: qty 100

## 2020-12-26 MED ORDER — ONDANSETRON HCL 4 MG/2ML IJ SOLN
INTRAMUSCULAR | Status: DC | PRN
  Administered 2020-12-26: 4 mg via INTRAVENOUS

## 2020-12-26 MED ORDER — MIDAZOLAM HCL 2 MG/2ML IJ SOLN
INTRAMUSCULAR | Status: AC
  Filled 2020-12-26: qty 2

## 2020-12-26 MED ORDER — ACETAMINOPHEN 500 MG PO TABS
1000.0000 mg | ORAL_TABLET | Freq: Four times a day (QID) | ORAL | Status: AC
  Administered 2020-12-26 – 2020-12-27 (×4): 1000 mg via ORAL
  Filled 2020-12-26 (×4): qty 2

## 2020-12-26 MED ORDER — OXYCODONE HCL 5 MG PO TABS: 10.0000 mg | ORAL_TABLET | ORAL | Status: DC | PRN

## 2020-12-26 MED ORDER — TRANEXAMIC ACID 1000 MG/10ML IV SOLN
INTRAVENOUS | Status: DC | PRN
  Administered 2020-12-26: 2000 mg via TOPICAL

## 2020-12-26 MED ORDER — ACETAMINOPHEN 325 MG PO TABS: 325.0000 mg | ORAL_TABLET | Freq: Four times a day (QID) | ORAL | Status: DC | PRN

## 2020-12-26 MED ORDER — OXYCODONE HCL 5 MG PO TABS
5.0000 mg | ORAL_TABLET | ORAL | Status: DC | PRN
  Administered 2020-12-26 (×2): 10 mg via ORAL
  Administered 2020-12-27: 5 mg via ORAL
  Administered 2020-12-27: 10 mg via ORAL
  Filled 2020-12-26 (×2): qty 2
  Filled 2020-12-26: qty 1
  Filled 2020-12-26 (×2): qty 2

## 2020-12-26 MED ORDER — HYDROMORPHONE HCL 1 MG/ML IJ SOLN
0.5000 mg | INTRAMUSCULAR | Status: DC | PRN
  Administered 2020-12-26: 1 mg via INTRAVENOUS
  Filled 2020-12-26: qty 1

## 2020-12-26 SURGICAL SUPPLY — 63 items
BAG COUNTER SPONGE SURGICOUNT (BAG) ×2 IMPLANT
BAG DECANTER FOR FLEXI CONT (MISCELLANEOUS) ×2 IMPLANT
BALL HIP CERAMIC (Hips) ×1 IMPLANT
CELLS DAT CNTRL 66122 CELL SVR (MISCELLANEOUS) IMPLANT
COVER PERINEAL POST (MISCELLANEOUS) ×2 IMPLANT
COVER SURGICAL LIGHT HANDLE (MISCELLANEOUS) ×2 IMPLANT
CUP SECTOR GRIPTON 50MM (Cup) ×2 IMPLANT
DERMABOND ADVANCED (GAUZE/BANDAGES/DRESSINGS) ×1
DERMABOND ADVANCED .7 DNX12 (GAUZE/BANDAGES/DRESSINGS) ×1 IMPLANT
DRAPE C-ARM 42X72 X-RAY (DRAPES) ×2 IMPLANT
DRAPE POUCH INSTRU U-SHP 10X18 (DRAPES) ×2 IMPLANT
DRAPE STERI IOBAN 125X83 (DRAPES) ×2 IMPLANT
DRAPE U-SHAPE 47X51 STRL (DRAPES) ×4 IMPLANT
DRSG AQUACEL AG ADV 3.5X10 (GAUZE/BANDAGES/DRESSINGS) ×2 IMPLANT
DURAPREP 26ML APPLICATOR (WOUND CARE) ×4 IMPLANT
ELECT BLADE 4.0 EZ CLEAN MEGAD (MISCELLANEOUS) ×2
ELECT REM PT RETURN 9FT ADLT (ELECTROSURGICAL) ×2
ELECTRODE BLDE 4.0 EZ CLN MEGD (MISCELLANEOUS) ×1 IMPLANT
ELECTRODE REM PT RTRN 9FT ADLT (ELECTROSURGICAL) ×1 IMPLANT
FEM STEM 12/14 TAPER SZ 4 HIP (Orthopedic Implant) ×2 IMPLANT
FEMORAL STEM 12/14 TPR SZ4 HIP (Orthopedic Implant) ×1 IMPLANT
GLOVE SURG LTX SZ7 (GLOVE) ×4 IMPLANT
GLOVE SURG SYN 7.5  E (GLOVE) ×4
GLOVE SURG SYN 7.5 E (GLOVE) ×4 IMPLANT
GLOVE SURG UNDER POLY LF SZ7 (GLOVE) ×4 IMPLANT
GLOVE SURG UNDER POLY LF SZ7.5 (GLOVE) ×4 IMPLANT
GOWN STRL REIN XL XLG (GOWN DISPOSABLE) ×2 IMPLANT
GOWN STRL REUS W/ TWL LRG LVL3 (GOWN DISPOSABLE) IMPLANT
GOWN STRL REUS W/ TWL XL LVL3 (GOWN DISPOSABLE) ×1 IMPLANT
GOWN STRL REUS W/TWL LRG LVL3 (GOWN DISPOSABLE)
GOWN STRL REUS W/TWL XL LVL3 (GOWN DISPOSABLE) ×1
HANDPIECE INTERPULSE COAX TIP (DISPOSABLE) ×1
HIP BALL CERAMIC (Hips) ×2 IMPLANT
HOOD PEEL AWAY FLYTE STAYCOOL (MISCELLANEOUS) ×4 IMPLANT
IV NS IRRIG 3000ML ARTHROMATIC (IV SOLUTION) ×2 IMPLANT
JET LAVAGE IRRISEPT WOUND (IRRIGATION / IRRIGATOR) ×2
KIT BASIN OR (CUSTOM PROCEDURE TRAY) ×2 IMPLANT
LAVAGE JET IRRISEPT WOUND (IRRIGATION / IRRIGATOR) ×1 IMPLANT
LINER ACET PNNCL PLUS4 NEUTRAL (Hips) ×1 IMPLANT
MARKER SKIN DUAL TIP RULER LAB (MISCELLANEOUS) ×2 IMPLANT
NEEDLE SPNL 18GX3.5 QUINCKE PK (NEEDLE) ×2 IMPLANT
PACK TOTAL JOINT (CUSTOM PROCEDURE TRAY) ×2 IMPLANT
PACK UNIVERSAL I (CUSTOM PROCEDURE TRAY) ×2 IMPLANT
PINNACLE PLUS 4 NEUTRAL (Hips) ×2 IMPLANT
RTRCTR WOUND ALEXIS 18CM MED (MISCELLANEOUS)
SAW OSC TIP CART 19.5X105X1.3 (SAW) ×2 IMPLANT
SCREW 6.5MMX25MM (Screw) ×2 IMPLANT
SET HNDPC FAN SPRY TIP SCT (DISPOSABLE) ×1 IMPLANT
STAPLER VISISTAT 35W (STAPLE) IMPLANT
SUT ETHIBOND 2 V 37 (SUTURE) ×2 IMPLANT
SUT ETHILON 2 0 FS 18 (SUTURE) ×6 IMPLANT
SUT VIC AB 0 CT1 27 (SUTURE) ×1
SUT VIC AB 0 CT1 27XBRD ANBCTR (SUTURE) ×1 IMPLANT
SUT VIC AB 1 CTX 36 (SUTURE) ×1
SUT VIC AB 1 CTX36XBRD ANBCTR (SUTURE) ×1 IMPLANT
SUT VIC AB 2-0 CT1 27 (SUTURE) ×2
SUT VIC AB 2-0 CT1 TAPERPNT 27 (SUTURE) ×2 IMPLANT
SYR 50ML LL SCALE MARK (SYRINGE) ×2 IMPLANT
TOWEL GREEN STERILE (TOWEL DISPOSABLE) ×2 IMPLANT
TRAY CATH 16FR W/PLASTIC CATH (SET/KITS/TRAYS/PACK) IMPLANT
TRAY FOLEY W/BAG SLVR 16FR (SET/KITS/TRAYS/PACK) ×1
TRAY FOLEY W/BAG SLVR 16FR ST (SET/KITS/TRAYS/PACK) ×1 IMPLANT
YANKAUER SUCT BULB TIP NO VENT (SUCTIONS) ×2 IMPLANT

## 2020-12-26 NOTE — Op Note (Signed)
RIGHT TOTAL HIP ARTHROPLASTY ANTERIOR APPROACH  Procedure Note Leanor Voris   [id number]  Pre-op Diagnosis: right hip avascular necrosis     Post-op Diagnosis: same   Operative Procedures  1. Total hip replacement; Right hip; uncemented [id number]   Surgeon: Frankey Shown, M.D.  Assist: Madalyn Rob, PA-C   Anesthesia: spinal  Prosthesis: Depuy Acetabulum: Pinnacle 50 mm Femur: Actis 4 HO Head: 32 mm size: +5 Liner: +4 Bearing Type: ceramic/poly  Total Hip Arthroplasty (Anterior Approach) Op Note:  After informed consent was obtained and the operative extremity marked in the holding area, the patient was brought back to the operating room and placed supine on the HANA table. Next, the operative extremity was prepped and draped in normal sterile fashion. Surgical timeout occurred verifying patient identification, surgical site, surgical procedure and administration of antibiotics.  A modified anterior Smith-Peterson approach to the hip was performed, using the interval between tensor fascia lata and sartorius.  Dissection was carried bluntly down onto the anterior hip capsule. The lateral femoral circumflex vessels were identified and coagulated. A capsulotomy was performed, there was a joint effusion and the capsular flaps tagged for later repair.  The neck osteotomy was performed. The femoral head was removed which showed delaminated cartilage, the acetabular rim was cleared of soft tissue and attention was turned to reaming the acetabulum.  Sequential reaming was performed under fluoroscopic guidance. We reamed to a size 49 mm, and then impacted the acetabular shell. A 25 mm cancellous screw was placed through the shell for added fixation.  The liner was then placed after irrigation and attention turned to the femur.  After placing the femoral hook, the leg was taken to externally rotated, extended and adducted position taking care to perform soft tissue releases to allow for  adequate mobilization of the femur. Soft tissue was cleared from the shoulder of the greater trochanter and the hook elevator used to improve exposure of the proximal femur. Sequential broaching performed up to a size 4. Trial neck and head were placed. The leg was brought back up to neutral and the construct reduced.  Antibiotic irrigation was placed in the surgical wound and kept for at least 1 minute.  The position and sizing of components, offset and leg lengths were checked using fluoroscopy. Stability of the construct was checked in extension and external rotation without any subluxation or impingement of prosthesis. We dislocated the prosthesis, dropped the leg back into position, removed trial components, and irrigated copiously. The final stem and head was then placed, the leg brought back up, the system reduced and fluoroscopy used to verify positioning.  We irrigated, obtained hemostasis and closed the capsule using #2 ethibond suture.  One gram of vancomycin powder was placed in the surgical bed.   One gram of topical tranexamic acid was injected into the joint.  The fascia was closed with #1 vicryl plus, the deep fat layer was closed with 0 vicryl, the subcutaneous layers closed with 2.0 Vicryl Plus and the skin closed with 2.0 nylon and dermabond. A sterile dressing was applied. The patient was awakened in the operating room and taken to recovery in stable condition.  All sponge, needle, and instrument counts were correct at the end of the case.   Tawanna Cooler, my PA, was a medical necessity for opening, closing, limb positioning, retracting, exposing, and overall facilitation and timely completion of the surgery.  Position: supine  Complications: see description of procedure.  Time Out: performed   Drains/Packing: none  Estimated blood loss: see anesthesia record  Returned to Recovery Room: in good condition.   Antibiotics: yes   Mechanical VTE (DVT) Prophylaxis: sequential  compression devices, TED thigh-high  Chemical VTE (DVT) Prophylaxis: aspirin   Fluid Replacement: see anesthesia record  Specimens Removed: 1 to pathology   Sponge and Instrument Count Correct? yes   PACU: portable radiograph - low AP   Plan/RTC: Return in 2 weeks for staple removal. Weight Bearing/Load Lower Extremity: full  Hip precautions: none Suture Removal: 2 weeks   N. Eduard Roux, MD [facility] 8:35 AM   Implant Name Type Inv. Item Serial No. Manufacturer Lot No. LRB No. Used Action  CUP SECTOR GRIPTON 50MM - [id number] Cup CUP SECTOR GRIPTON 50MM  [employer] [id number] Right 1 Implanted  SCREW 6.5MMX25MM - [id number] Screw SCREW 6.5MMX25MM  [employer] [id number] Right 1 Implanted  HIP BALL CERAMIC - [id number] Hips HIP BALL CERAMIC  [employer] [id number] Right 1 Implanted  FEM STEM 12/14 TAPER SZ 4 HIP - [id number] Orthopedic Implant FEM STEM 12/14 TAPER SZ 4 HIP  [employer] [id number] Right 1 Implanted

## 2020-12-26 NOTE — Anesthesia Postprocedure Evaluation (Signed)
Anesthesia Post Note  Patient: Monica Cobb  Procedure(s) Performed: RIGHT TOTAL HIP ARTHROPLASTY ANTERIOR APPROACH (Right: Hip)     Patient location during evaluation: PACU Anesthesia Type: MAC and Spinal Level of consciousness: awake and alert and oriented Pain management: pain level controlled Vital Signs Assessment: post-procedure vital signs reviewed and stable Respiratory status: spontaneous breathing, nonlabored ventilation and respiratory function stable Cardiovascular status: blood pressure returned to baseline and stable Postop Assessment: no headache, no backache, spinal receding and no apparent nausea or vomiting Anesthetic complications: no   No notable events documented.  Last Vitals:  Vitals:   12/26/20 0935 12/26/20 0950  BP: (!) 117/52 123/61  Pulse: (!) 59 (!) 58  Resp: 11 11  Temp: (!) 36.2 C   SpO2: 99% 99%    Last Pain:  Vitals:   12/26/20 0935  TempSrc:   PainSc: 0-No pain                 Pervis Hocking

## 2020-12-26 NOTE — H&P (Signed)
PREOPERATIVE H&P  Chief Complaint: right hip avascular necrosis  HPI: Monica Cobb is a 65 y.o. female who presents for surgical treatment of right hip avascular necrosis.  She denies any changes in medical history.  Past Medical History:  Diagnosis Date   Cancer ([place])    Chronic kidney disease    CKD 3   Coronary artery disease    GERD (gastroesophageal reflux disease)    Hypertension    PONV (postoperative nausea and vomiting)    Past Surgical History:  Procedure Laterality Date   APPENDECTOMY     BACK SURGERY     laser back surgery for herniated disc   CARDIAC CATHETERIZATION     CHOLECYSTECTOMY     JOINT REPLACEMENT Left    left hip   LUMBAR FUSION     skin cancer removal     chest, face and legs   TUBAL LIGATION     Social History   Socioeconomic History   Marital status: Married    Spouse name: Not on file   Number of children: Not on file   Years of education: Not on file   Highest education level: Not on file  Occupational History   Not on file  Tobacco Use   Smoking status: Former    Packs/day: 1.00    Types: Cigarettes   Smokeless tobacco: Never  Vaping Use   Vaping Use: Never used  Substance and Sexual Activity   Alcohol use: Not Currently   Drug use: Never   Sexual activity: Not Currently  Other Topics Concern   Not on file  Social History Narrative   Not on file   Social Determinants of Health   Financial Resource Strain: Not on file  Food Insecurity: Not on file  Transportation Needs: Not on file  Physical Activity: Not on file  Stress: Not on file  Social Connections: Not on file   Family History  Problem Relation Age of Onset   Breast cancer Maternal Grandmother    No Known Allergies Prior to Admission medications   Medication Sig Start Date End Date Taking? Authorizing Provider  amitriptyline (ELAVIL) 100 MG tablet Take 100 mg by mouth at bedtime. 03/04/18  Yes [provider]  atorvastatin (LIPITOR) 20 MG tablet  Take 20 mg by mouth daily. 03/13/18  Yes [provider]  clonazePAM (KLONOPIN) 1 MG tablet Take 1 mg by mouth 2 (two) times daily as needed for anxiety. 08/25/14  Yes [provider]  dicyclomine (BENTYL) 10 MG capsule Take 10 mg by mouth 4 (four) times daily as needed for spasms. 10/13/19  Yes [provider]  estradiol (CLIMARA - DOSED IN MG/24 HR) 0.05 mg/24hr patch Place 0.05 mg onto the skin 2 (two) times a week. 01/28/18  Yes [provider]  furosemide (LASIX) 20 MG tablet Take 20 mg by mouth daily. 10/06/19  Yes [provider]  Hyoscyamine Sulfate SL 0.125 MG SUBL Place 0.125 mg under the tongue daily as needed (IBS). 12/28/15  Yes [provider]  pantoprazole (PROTONIX) 40 MG tablet Take 40 mg by mouth 2 (two) times daily. 08/03/14  Yes [provider]  tiZANidine (ZANAFLEX) 4 MG tablet Take 4 mg by mouth at bedtime as needed for muscle spasms. 08/17/19  Yes [provider]  traMADol (ULTRAM) 50 MG tablet Take 1-2 tablets (50-100 mg total) by mouth every 12 (twelve) hours as needed. 11/22/20  Yes Leandrew Koyanagi, MD  varenicline (CHANTIX) 1 MG tablet Take  1 mg by mouth 2 (two) times daily.   Yes [provider]  albuterol (VENTOLIN HFA) 108 (90 Base) MCG/ACT inhaler Inhale 2 puffs into the lungs every 4 (four) hours as needed for wheezing or shortness of breath. Patient not taking: Reported on 12/19/2020 12/03/19   Margarette Canada, NP  aspirin EC 81 MG tablet Take 1 tablet (81 mg total) by mouth 2 (two) times daily. To be taken after surgery 12/20/20   Aundra Dubin, PA-C  benzonatate (TESSALON) 200 MG capsule Take 1 capsule (200 mg total) by mouth 3 (three) times daily as needed for cough. Patient not taking: Reported on 12/19/2020 12/03/19   Margarette Canada, NP  docusate sodium (COLACE) 100 MG capsule Take 1 capsule (100 mg total) by mouth daily as needed. 12/20/20 12/20/21  Aundra Dubin, PA-C  methocarbamol (ROBAXIN)  500 MG tablet Take 1 tablet (500 mg total) by mouth 2 (two) times daily as needed. To be taken after surgery 12/20/20   Aundra Dubin, PA-C  ondansetron (ZOFRAN) 4 MG tablet Take 1 tablet (4 mg total) by mouth every 8 (eight) hours as needed for nausea or vomiting. 12/20/20   Aundra Dubin, PA-C  oxyCODONE-acetaminophen (PERCOCET) 5-325 MG tablet Take 1-2 tablets by mouth every 6 (six) hours as needed. To be taken after surgery 12/20/20   Aundra Dubin, PA-C  Spacer/Aero-Holding Chambers (AEROCHAMBER MV) inhaler Use as instructed 12/03/19   Margarette Canada, NP     Positive ROS: All other systems have been reviewed and were otherwise negative with the exception of those mentioned in the HPI and as above.  Physical Exam: General: Alert, no acute distress Cardiovascular: No pedal edema Respiratory: No cyanosis, no use of accessory musculature GI: abdomen soft Skin: No lesions in the area of chief complaint Neurologic: Sensation intact distally Psychiatric: Patient is competent for consent with normal mood and affect Lymphatic: no lymphedema  MUSCULOSKELETAL: exam stable  Assessment: right hip avascular necrosis  Plan: Plan for Procedure(s): RIGHT TOTAL HIP ARTHROPLASTY ANTERIOR APPROACH  The risks benefits and alternatives were discussed with the patient including but not limited to the risks of nonoperative treatment, versus surgical intervention including infection, bleeding, nerve injury,  blood clots, cardiopulmonary complications, morbidity, mortality, among others, and they were willing to proceed.   Preoperative templating of the joint replacement has been completed, documented, and submitted to the Operating Room personnel in order to optimize intra-operative equipment management.   Eduard Roux, MD 12/26/2020 5:43 AM

## 2020-12-26 NOTE — Transfer of Care (Signed)
Immediate Anesthesia Transfer of Care Note  Patient: Monica Cobb  Procedure(s) Performed: RIGHT TOTAL HIP ARTHROPLASTY ANTERIOR APPROACH (Right: Hip)  Patient Location: PACU  Anesthesia Type:MAC and Spinal  Level of Consciousness: awake, drowsy, patient cooperative and responds to stimulation  Airway & Oxygen Therapy: Patient Spontanous Breathing and Patient connected to nasal cannula oxygen  Post-op Assessment: Report given to RN and Post -op Vital signs reviewed and stable  Post vital signs: Reviewed and stable  Last Vitals:  Vitals Value Taken Time  BP    Temp    Pulse 65 12/26/20 0902  Resp 6 12/26/20 0902  SpO2 99 % 12/26/20 0902  Vitals shown include unvalidated device data.  Last Pain:  Vitals:   12/26/20 0620  TempSrc: Oral  PainSc:          Complications: No notable events documented.

## 2020-12-26 NOTE — Evaluation (Signed)
Physical Therapy Evaluation Patient Details Name: Monica Cobb MRN: [id number] DOB: May 15, 1955 Today's Date: 12/26/2020  History of Present Illness  Pt is a 65 y/o female s/p R THA, direct anterior on 12/5. PMH includes CKD, CAD, HTN, L THA, and lumbar fusion.  Clinical Impression  Pt is s/p surgery above with deficits below. Pt requiring min guard A for mobility tasks using RW. Distance limited secondary to pain. Pt reports she plans to stay with her mother at d/c. Will continue to follow acutely.        Recommendations for follow up therapy are one component of a multi-disciplinary discharge planning process, led by the attending physician.  Recommendations may be updated based on patient status, additional functional criteria and insurance authorization.  Follow Up Recommendations Follow physician's recommendations for discharge plan and follow up therapies    Assistance Recommended at Discharge Intermittent Supervision/Assistance  Functional Status Assessment Patient has had a recent decline in their functional status and demonstrates the ability to make significant improvements in function in a reasonable and predictable amount of time.  Equipment Recommendations  None recommended by PT    Recommendations for Other Services       Precautions / Restrictions Precautions Precautions: Fall Restrictions Weight Bearing Restrictions: Yes RLE Weight Bearing: Weight bearing as tolerated      Mobility  Bed Mobility Overal bed mobility: Needs Assistance Bed Mobility: Supine to Sit     Supine to sit: Min assist     General bed mobility comments: Min A for RLE assist. Increased time required.    Transfers Overall transfer level: Needs assistance Equipment used: Rolling walker (2 wheels) Transfers: Sit to/from Stand Sit to Stand: Min guard           General transfer comment: Min guard for safety. Cues for hand placement.    Ambulation/Gait Ambulation/Gait assistance: Min  guard Gait Distance (Feet): 50 Feet Assistive device: Rolling walker (2 wheels) Gait Pattern/deviations: Step-through pattern;Decreased step length - right;Decreased step length - left;Decreased weight shift to right Gait velocity: Decreased     General Gait Details: Min guard for safety. Cues for sequencing. Distance limited secondary to pain.  Stairs            Wheelchair Mobility    Modified Rankin (Stroke Patients Only)       Balance Overall balance assessment: Needs assistance Sitting-balance support: No upper extremity supported;Feet supported Sitting balance-Leahy Scale: Good     Standing balance support: Bilateral upper extremity supported Standing balance-Leahy Scale: Fair Standing balance comment: Able to stand at sink to wash hands without UE support                             Pertinent Vitals/Pain Pain Assessment: Faces Faces Pain Scale: Hurts even more Pain Location: R hip Pain Descriptors / Indicators: Grimacing;Operative site guarding Pain Intervention(s): Limited activity within patient's tolerance;Monitored during session;Repositioned    Home Living Family/patient expects to be discharged to:: Private residence Living Arrangements: Children Available Help at Discharge: Family;Available 24 hours/day Type of Home: House Home Access: Stairs to enter Entrance Stairs-Rails: None Entrance Stairs-Number of Steps: 1 at her moms (5 at her home)   Home Layout: One level Home Equipment: Conservation officer, nature (2 wheels);Rollator (4 wheels);BSC/3in1 Additional Comments: Pt planning to stay with her mom at d/c    Prior Function Prior Level of Function : Independent/Modified Independent             Mobility  Comments: Was using cane for ambulation       Hand Dominance        Extremity/Trunk Assessment   Upper Extremity Assessment Upper Extremity Assessment: Overall WFL for tasks assessed    Lower Extremity Assessment Lower Extremity  Assessment: RLE deficits/detail RLE Deficits / Details: deficits consistent with post op pain and weakness.    Cervical / Trunk Assessment Cervical / Trunk Assessment: Normal  Communication   Communication: No difficulties  Cognition Arousal/Alertness: Awake/alert Behavior During Therapy: WFL for tasks assessed/performed Overall Cognitive Status: Within Functional Limits for tasks assessed                                          General Comments      Exercises     Assessment/Plan    PT Assessment Patient needs continued PT services  PT Problem List Decreased strength;Decreased range of motion;Decreased activity tolerance;Decreased balance;Decreased mobility;Decreased knowledge of use of DME;Decreased knowledge of precautions;Pain       PT Treatment Interventions DME instruction;Gait training;Functional mobility training;Stair training;Therapeutic exercise;Balance training;Therapeutic activities;Patient/family education    PT Goals (Current goals can be found in the Care Plan section)  Acute Rehab PT Goals Patient Stated Goal: to decrease pain PT Goal Formulation: With patient Time For Goal Achievement: 01/09/21 Potential to Achieve Goals: Good    Frequency 7X/week   Barriers to discharge        Co-evaluation               AM-PAC PT "6 Clicks" Mobility  Outcome Measure Help needed turning from your back to your side while in a flat bed without using bedrails?: A Little Help needed moving from lying on your back to sitting on the side of a flat bed without using bedrails?: A Little Help needed moving to and from a bed to a chair (including a wheelchair)?: A Little Help needed standing up from a chair using your arms (e.g., wheelchair or bedside chair)?: A Little Help needed to walk in hospital room?: A Little Help needed climbing 3-5 steps with a railing? : A Little 6 Click Score: 18    End of Session Equipment Utilized During Treatment:  Gait belt Activity Tolerance: Patient limited by pain Patient left: in chair;with call bell/phone within reach Nurse Communication: Mobility status PT Visit Diagnosis: Other abnormalities of gait and mobility (R26.89);Pain Pain - Right/Left: Right Pain - part of body: Hip    Time: 2979-8921 PT Time Calculation (min) (ACUTE ONLY): 16 min   Charges:   PT Evaluation $PT Eval Low Complexity: 1 Low          Lou Miner, DPT  Acute Rehabilitation Services  Pager: [phone] Office: [phone]   Monica Cobb 12/26/2020, 4:46 PM

## 2020-12-26 NOTE — Discharge Instructions (Signed)

## 2020-12-27 DIAGNOSIS — M87051 Idiopathic aseptic necrosis of right femur: Secondary | ICD-10-CM | POA: Diagnosis not present

## 2020-12-27 DIAGNOSIS — M87021 Idiopathic aseptic necrosis of right humerus: Secondary | ICD-10-CM | POA: Diagnosis not present

## 2020-12-27 LAB — BASIC METABOLIC PANEL
Anion gap: 8 (ref 5–15)
BUN: 8 mg/dL (ref 8–23)
CO2: 27 mmol/L (ref 22–32)
Calcium: 8.6 mg/dL — ABNORMAL LOW (ref 8.9–10.3)
Chloride: 101 mmol/L (ref 98–111)
Creatinine, Ser: 0.83 mg/dL (ref 0.44–1.00)
GFR, Estimated: >60 mL/min (ref >60)
Glucose, Bld: 151 mg/dL — ABNORMAL HIGH (ref 70–99)
Potassium: 3.4 mmol/L — ABNORMAL LOW (ref 3.5–5.1)
Sodium: 136 mmol/L (ref 135–145)

## 2020-12-27 LAB — PREPARE RBC (CROSSMATCH)

## 2020-12-27 LAB — CBC
HCT: 24.9 % — ABNORMAL LOW (ref 36.0–46.0)
Hemoglobin: 7.9 g/dL — ABNORMAL LOW (ref 12.0–15.0)
MCH: 29.6 pg (ref 26.0–34.0)
MCHC: 31.7 g/dL (ref 30.0–36.0)
MCV: 93.3 fL (ref 80.0–100.0)
Platelets: 237 10*3/uL (ref 150–400)
RBC: 2.67 MIL/uL — ABNORMAL LOW (ref 3.87–5.11)
RDW: 16.6 % — ABNORMAL HIGH (ref 11.5–15.5)
WBC: 6.3 10*3/uL (ref 4.0–10.5)
nRBC: 0 % (ref 0.0–0.2)

## 2020-12-27 LAB — HEMOGLOBIN AND HEMATOCRIT, BLOOD
HCT: 32.5 % — ABNORMAL LOW (ref 36.0–46.0)
Hemoglobin: 10.3 g/dL — ABNORMAL LOW (ref 12.0–15.0)

## 2020-12-27 MED ORDER — SODIUM CHLORIDE 0.9% IV SOLUTION: Freq: Once | INTRAVENOUS | Status: AC

## 2020-12-27 MED ORDER — FUROSEMIDE 10 MG/ML IJ SOLN
20.0000 mg | Freq: Once | INTRAMUSCULAR | Status: AC
  Administered 2020-12-27: 20 mg via INTRAVENOUS
  Filled 2020-12-27: qty 4

## 2020-12-27 NOTE — Progress Notes (Addendum)
Subjective: 1 Day Post-Op Procedure(s) (LRB): RIGHT TOTAL HIP ARTHROPLASTY ANTERIOR APPROACH (Right) Patient reports pain as mild.  No nausea/vomiting, lightheadedness/dizziness, chest pain/pressure/palpitations/sob  Objective: Vital signs in last 24 hours: Temp:  [97.1 F (36.2 C)-98.5 F (36.9 C)] 98.4 F (36.9 C) (12/06 0506) Pulse Rate:  [58-85] 85 (12/06 0506) Resp:  [11-20] 18 (12/06 0506) BP: (103-134)/(46-66) 103/50 (12/06 0506) SpO2:  [97 %-100 %] 97 % (12/06 0506)  Intake/Output from previous day: 12/05 0701 - 12/06 0700 In: 1050 [P.O.:100; I.V.:700; IV Piggyback:250] Out: 975 [Urine:475; Blood:500] Intake/Output this shift: No intake/output data recorded.  Recent Labs    12/27/20 0553  HGB 7.9*   Recent Labs    12/27/20 0553  WBC 6.3  RBC 2.67*  HCT 24.9*  PLT 237   Recent Labs    12/27/20 0553  NA 136  K 3.4*  CL 101  CO2 27  BUN 8  CREATININE 0.83  GLUCOSE 151*  CALCIUM 8.6*   No results for input(s): LABPT, INR in the last 72 hours.  Neurologically intact Neurovascular intact Sensation intact distally Intact pulses distally Dorsiflexion/Plantar flexion intact Incision: dressing C/D/I No cellulitis present Compartment soft   Assessment/Plan: 1 Day Post-Op Procedure(s) (LRB): RIGHT TOTAL HIP ARTHROPLASTY ANTERIOR APPROACH (Right) Advance diet Up with therapy WBAT RLE ABLA- slightly hypotensive, but no other symptoms.  Will transfuse with one unit of blood this am D/c home with PT exercises after first or second PT session depending on mobilization.      Aundra Dubin 12/27/2020, 8:03 AM

## 2020-12-27 NOTE — TOC Transition Note (Signed)
Transition of Care [facility]) - CM/SW Discharge Note   Patient Details  Name: Monica Cobb MRN: [id number] Date of Birth: 1955/09/18  [facility] CM/SW Contact:  Carles Collet, RN Phone Number: 12/27/2020, 8:25 AM   Clinical Narrative:    Patient pre assigned to [facility] as needed for therapy post op as determined by physician. DME will be provided to patient by nursing staff.           Patient Goals and CMS Choice        Discharge Placement                       Discharge Plan and Services                                     Social Determinants of Health (SDOH) Interventions     Readmission Risk Interventions No flowsheet data found.

## 2020-12-27 NOTE — Progress Notes (Signed)
Physical Therapy Treatment Patient Details Name: Monica Cobb MRN: [id number] DOB: [birth year] Today's Date: 12/27/2020   History of Present Illness Pt is a 65 y/o female s/p R THA, direct anterior on 12/5. PMH includes CKD, CAD, HTN, L THA, and lumbar fusion.    PT Comments    Pt progressing well towards all goals. Pt functioning at supervision level. Pt with good home set up and support. Pt with increased amb tolerance and ability to complete stair negotiation. Pt given hip HEP, pt with good return demonstration. Acute PT to cont to follow to progress strength, ROM and mobility.    Recommendations for follow up therapy are one component of a multi-disciplinary discharge planning process, led by the attending physician.  Recommendations may be updated based on patient status, additional functional criteria and insurance authorization.  Follow Up Recommendations  Follow physician's recommendations for discharge plan and follow up therapies     Assistance Recommended at Discharge Intermittent Supervision/Assistance  Equipment Recommendations  None recommended by PT (pt has RW at home)    Recommendations for Other Services       Precautions / Restrictions Precautions Precautions: Fall Restrictions Weight Bearing Restrictions: Yes RLE Weight Bearing: Weight bearing as tolerated     Mobility  Bed Mobility Overal bed mobility: Needs Assistance Bed Mobility: Supine to Sit     Supine to sit: Supervision     General bed mobility comments: HOB flat, verbal cues for long sit technique and to complete quad set prior to moving LEs to EOB    Transfers Overall transfer level: Needs assistance Equipment used: Rolling walker (2 wheels) Transfers: Sit to/from Stand Sit to Stand: Supervision           General transfer comment: verbal cues for hand placement    Ambulation/Gait Ambulation/Gait assistance: Min guard Gait Distance (Feet): 300 Feet Assistive device: Rolling walker  (2 wheels) Gait Pattern/deviations: Step-through pattern;Antalgic Gait velocity: Decreased Gait velocity interpretation: 1.31 - 2.62 ft/sec, indicative of limited community ambulator   General Gait Details: verbal cues to relax shoulders, stay in the walker and equal weight shift onto R LE, improved with distance   Stairs Stairs: Yes Stairs assistance: Min guard Stair Management: One rail Left;Step to pattern;Forwards Number of Stairs: 5 General stair comments: pt with good technique   Wheelchair Mobility    Modified Rankin (Stroke Patients Only)       Balance Overall balance assessment: Needs assistance Sitting-balance support: No upper extremity supported;Feet supported Sitting balance-Leahy Scale: Good     Standing balance support: Bilateral upper extremity supported Standing balance-Leahy Scale: Fair Standing balance comment: Able to stand in walker and complete standing exercises                            Cognition Arousal/Alertness: Awake/alert Behavior During Therapy: WFL for tasks assessed/performed Overall Cognitive Status: Within Functional Limits for tasks assessed                                          Exercises Total Joint Exercises Ankle Circles/Pumps: AROM;Both;10 reps;Seated Quad Sets: AROM;Right;5 reps;Supine Gluteal Sets: AROM;Both;10 reps;Seated Heel Slides: AAROM;Right;5 reps;Supine Hip ABduction/ADduction: AROM;Right;Standing;5 reps Long Arc Quad: AROM;Right;10 reps;Seated (with 5 sec hold) Marching in Standing: AROM;Right;10 reps;Standing    General Comments General comments (skin integrity, edema, etc.): incision not observed, per [name], [name] pt to receive  1 unit of blood as Hemoglobin and BP are low, however pt asymptomatic      Pertinent Vitals/Pain Pain Assessment: 0-10 Pain Score: 3  Pain Location: R hip Pain Descriptors / Indicators: Grimacing;Operative site guarding    Home Living                           Prior Function            PT Goals (current goals can now be found in the care plan section) Acute Rehab PT Goals Patient Stated Goal: to decrease pain PT Goal Formulation: With patient Time For Goal Achievement: 01/09/21 Potential to Achieve Goals: Good Progress towards PT goals: Progressing toward goals    Frequency    7X/week      PT Plan Current plan remains appropriate    Co-evaluation              AM-PAC PT "6 Clicks" Mobility   Outcome Measure  Help needed turning from your back to your side while in a flat bed without using bedrails?: None Help needed moving from lying on your back to sitting on the side of a flat bed without using bedrails?: None Help needed moving to and from a bed to a chair (including a wheelchair)?: None Help needed standing up from a chair using your arms (e.g., wheelchair or bedside chair)?: A Little Help needed to walk in hospital room?: A Little Help needed climbing 3-5 steps with a railing? : A Little 6 Click Score: 21    End of Session Equipment Utilized During Treatment: Gait belt Activity Tolerance: Patient limited by pain Patient left: in chair;with call bell/phone within reach Nurse Communication: Mobility status PT Visit Diagnosis: Other abnormalities of gait and mobility (R26.89);Pain Pain - Right/Left: Right Pain - part of body: Hip     Time: 5974-1638 PT Time Calculation (min) (ACUTE ONLY): 31 min  Charges:  $Gait Training: 8-22 mins $Therapeutic Exercise: 8-22 mins                     Kittie Plater, PT, DPT Acute Rehabilitation Services Pager #: [phone] Office #: [phone]    Berline Lopes 12/27/2020, 9:36 AM

## 2020-12-27 NOTE — Discharge Summary (Signed)
Patient ID: Monica Cobb MRN: [id number] DOB/AGE: 1955-12-10 65 y.o.  Admit date: 12/26/2020 Discharge date: 12/27/2020  Admission Diagnoses:  Principal Problem:   Avascular necrosis of bone of right hip ([place]) Active Problems:   Status post total replacement of right hip   Discharge Diagnoses:  Same  Past Medical History:  Diagnosis Date   Cancer ([place])    Chronic kidney disease    CKD 3   Coronary artery disease    GERD (gastroesophageal reflux disease)    Hypertension    PONV (postoperative nausea and vomiting)     Surgeries: Procedure(s): RIGHT TOTAL HIP ARTHROPLASTY ANTERIOR APPROACH on 12/26/2020   Consultants:   Discharged Condition: Improved  Hospital Course: Monica Cobb is an 65 y.o. female who was admitted 12/26/2020 for operative treatment ofAvascular necrosis of bone of right hip ([place]). Patient has severe unremitting pain that affects sleep, daily activities, and work/hobbies. After pre-op clearance the patient was taken to the operating room on 12/26/2020 and underwent  Procedure(s): RIGHT TOTAL HIP ARTHROPLASTY ANTERIOR APPROACH.    Patient was given perioperative antibiotics:  Anti-infectives (From admission, onward)    Start     Dose/Rate Route Frequency Ordered Stop   12/26/20 1300  ceFAZolin (ANCEF) IVPB 2g/100 mL premix        2 g 200 mL/hr over 30 Minutes Intravenous Every 6 hours 12/26/20 1014 12/26/20 1924   12/26/20 0807  vancomycin (VANCOCIN) powder  Status:  Discontinued          As needed 12/26/20 0807 12/26/20 0858   12/26/20 0602  ceFAZolin (ANCEF) 2-4 GM/100ML-% IVPB       Note to Pharmacy: Rocky Morel D: cabinet override      12/26/20 0602 12/26/20 0722   12/26/20 0600  ceFAZolin (ANCEF) IVPB 2g/100 mL premix        2 g 200 mL/hr over 30 Minutes Intravenous On call to O.R. 12/26/20 0557 12/26/20 0719        Patient was given sequential compression devices, early ambulation, and chemoprophylaxis to prevent DVT.  Patient benefited  maximally from hospital stay and there were no complications.    Recent vital signs: Patient Vitals for the past 24 hrs:  BP Temp Temp src Pulse Resp SpO2  12/27/20 0506 (!) 103/50 98.4 F (36.9 C) Oral 85 18 97 %  12/26/20 2346 (!) 114/46 98.5 F (36.9 C) Oral 79 20 98 %  12/26/20 2005 (!) 134/50 98.1 F (36.7 C) Oral 70 16 97 %  12/26/20 1626 (!) 130/58 97.9 F (36.6 C) Oral 77 16 100 %  12/26/20 1024 122/65 97.8 F (36.6 C) Oral 66 18 99 %  12/26/20 1005 (!) 132/59 (!) 97.2 F (36.2 C) -- 61 14 99 %  12/26/20 0950 123/61 -- -- (!) 58 11 99 %  12/26/20 0935 (!) 117/52 (!) 97.2 F (36.2 C) -- (!) 59 11 99 %  12/26/20 0920 (!) 118/56 -- -- (!) 58 11 100 %  12/26/20 0905 107/66 (!) 97.1 F (36.2 C) -- 62 13 100 %     Recent laboratory studies:  Recent Labs    12/27/20 0553  WBC 6.3  HGB 7.9*  HCT 24.9*  PLT 237  NA 136  K 3.4*  CL 101  CO2 27  BUN 8  CREATININE 0.83  GLUCOSE 151*  CALCIUM 8.6*     Discharge Medications:   Allergies as of 12/27/2020   No Known Allergies      Medication List  STOP taking these medications    albuterol 108 (90 Base) MCG/ACT inhaler Commonly known as: VENTOLIN HFA   benzonatate 200 MG capsule Commonly known as: TESSALON   tiZANidine 4 MG tablet Commonly known as: ZANAFLEX   traMADol 50 MG tablet Commonly known as: ULTRAM       TAKE these medications    AeroChamber MV inhaler Use as instructed   amitriptyline 100 MG tablet Commonly known as: ELAVIL Take 100 mg by mouth at bedtime.   aspirin EC 81 MG tablet Take 1 tablet (81 mg total) by mouth 2 (two) times daily. To be taken after surgery   atorvastatin 20 MG tablet Commonly known as: LIPITOR Take 20 mg by mouth daily.   clonazePAM 1 MG tablet Commonly known as: KLONOPIN Take 1 mg by mouth 2 (two) times daily as needed for anxiety.   dicyclomine 10 MG capsule Commonly known as: BENTYL Take 10 mg by mouth 4 (four) times daily as needed for  spasms.   docusate sodium 100 MG capsule Commonly known as: Colace Take 1 capsule (100 mg total) by mouth daily as needed.   estradiol 0.05 mg/24hr patch Commonly known as: CLIMARA - Dosed in mg/24 hr Place 0.05 mg onto the skin 2 (two) times a week.   furosemide 20 MG tablet Commonly known as: LASIX Take 20 mg by mouth daily.   Hyoscyamine Sulfate SL 0.125 MG Subl Place 0.125 mg under the tongue daily as needed (IBS).   methocarbamol 500 MG tablet Commonly known as: Robaxin Take 1 tablet (500 mg total) by mouth 2 (two) times daily as needed. To be taken after surgery   ondansetron 4 MG tablet Commonly known as: Zofran Take 1 tablet (4 mg total) by mouth every 8 (eight) hours as needed for nausea or vomiting.   oxyCODONE-acetaminophen 5-325 MG tablet Commonly known as: Percocet Take 1-2 tablets by mouth every 6 (six) hours as needed. To be taken after surgery   pantoprazole 40 MG tablet Commonly known as: PROTONIX Take 40 mg by mouth 2 (two) times daily.   varenicline 1 MG tablet Commonly known as: CHANTIX Take 1 mg by mouth 2 (two) times daily.               Durable Medical Equipment  (From admission, onward)           Start     Ordered   12/26/20 1015  DME Walker rolling  Once       Question:  Patient needs a walker to treat with the following condition  Answer:  History of hip replacement   12/26/20 1014   12/26/20 1015  DME 3 n 1  Once        12/26/20 1014   12/26/20 1015  DME Bedside commode  Once       Question:  Patient needs a bedside commode to treat with the following condition  Answer:  History of hip replacement   12/26/20 1014            Diagnostic Studies: CT HEAD WO CONTRAST (5MM)  Result Date: 12/09/2020 CLINICAL DATA:  Fever, chills, body aches and headaches. EXAM: CT HEAD WITHOUT CONTRAST TECHNIQUE: Contiguous axial images were obtained from the base of the skull through the vertex without intravenous contrast. COMPARISON:   None. FINDINGS: Brain: No evidence of acute infarction, hemorrhage, hydrocephalus, extra-axial collection or mass lesion/mass effect. Vascular: No hyperdense vessel or unexpected calcification. Skull: Normal. Negative for fracture or focal lesion. Sinuses/Orbits: No acute  finding. Other: None. IMPRESSION: No acute intracranial pathology. Electronically Signed   By: Virgina Norfolk M.D.   On: 12/09/2020 23:40   DG Pelvis Portable  Result Date: 12/26/2020 CLINICAL DATA:  Postop for right hip replacement EXAM: PORTABLE PELVIS 1-2 VIEWS COMPARISON:  Intraoperative views of earlier today. FINDINGS: Bilateral hip arthroplasty. No periprosthetic fracture or acute hardware complication. IMPRESSION: Interval right and remote left hip arthroplasty.  No acute findings. Electronically Signed   By: Abigail Miyamoto M.D.   On: 12/26/2020 09:20   DG Chest Port 1 View  Result Date: 12/09/2020 CLINICAL DATA:  Neck pain. EXAM: PORTABLE CHEST 1 VIEW COMPARISON:  December 21, 2013 FINDINGS: Diffuse, chronic appearing increased lung markings are seen. There is no evidence of acute infiltrate, pleural effusion or pneumothorax. The heart size and mediastinal contours are within normal limits. Moderate severity calcification of the aortic arch is seen. The visualized skeletal structures are unremarkable. IMPRESSION: No acute or active cardiopulmonary disease. Electronically Signed   By: Virgina Norfolk M.D.   On: 12/09/2020 22:12   DG C-Arm 1-60 Min-No Report  Result Date: 12/26/2020 Fluoroscopy was utilized by the requesting physician.  No radiographic interpretation.   DG C-Arm 1-60 Min-No Report  Result Date: 12/26/2020 Fluoroscopy was utilized by the requesting physician.  No radiographic interpretation.   DG HIP OPERATIVE UNILAT WITH PELVIS RIGHT  Result Date: 12/26/2020 CLINICAL DATA:  Right hip replacement EXAM: OPERATIVE right HIP (WITH PELVIS IF PERFORMED) 2 VIEWS TECHNIQUE: Fluoroscopic spot image(s) were  submitted for interpretation post-operatively. COMPARISON:  Pelvis radiographs 11/22/2020 FINDINGS: Two C-arm fluoroscopic images were obtained intraoperatively and submitted for post operative interpretation. There has been interval right hip arthroplasty. Hardware alignment is within expected limits, without evidence of immediate complication. Left hip arthroplasty hardware is also noted, incompletely imaged. Fluoro time 21 seconds. Please see the performing provider's procedural report for further detail. IMPRESSION: Status post right hip arthroplasty without evidence of complication. Electronically Signed   By: Valetta Mole M.D.   On: 12/26/2020 09:01    Disposition: Discharge disposition: 01-Home or Self Care          Follow-up Information     Leandrew Koyanagi, MD. Schedule an appointment as soon as possible for a visit in 2 week(s).   Specialty: Orthopedic Surgery Contact information: [address] [place] [address] [phone]                  Signed: Aundra Dubin 12/27/2020, 8:09 AM

## 2020-12-27 NOTE — Progress Notes (Signed)
Patient alert and oriented, voiding adequately, skin clean, dry and intact without evidence of skin break down, or symptoms of complications - no redness or edema noted, only slight tenderness at site.  Patient states pain is manageable at time of discharge. Patient has an appointment with MD December 20th

## 2020-12-28 ENCOUNTER — Encounter (HOSPITAL_COMMUNITY): Payer: Self-pay | Admitting: Orthopaedic Surgery

## 2020-12-28 LAB — TYPE AND SCREEN
ABO/RH(D): A POS
Antibody Screen: NEGATIVE
Unit division: 0

## 2020-12-28 LAB — BPAM RBC
Blood Product Expiration Date: 202212192359
ISSUE DATE / TIME: 202212060913
Unit Type and Rh: 6200

## 2020-12-30 ENCOUNTER — Telehealth: Payer: Self-pay | Admitting: Orthopaedic Surgery

## 2020-12-30 ENCOUNTER — Other Ambulatory Visit: Payer: Self-pay | Admitting: Physician Assistant

## 2020-12-30 MED ORDER — OXYCODONE-ACETAMINOPHEN 5-325 MG PO TABS: 1.0000 | ORAL_TABLET | Freq: Four times a day (QID) | ORAL | 0 refills | Status: DC | PRN

## 2020-12-30 NOTE — Telephone Encounter (Signed)
Sent in

## 2020-12-30 NOTE — Telephone Encounter (Signed)
Pt called requesting a refill of oxycodone. Please send to pharmacy on file. Pt phone number is [phone]

## 2021-01-06 ENCOUNTER — Other Ambulatory Visit: Payer: Self-pay | Admitting: Orthopaedic Surgery

## 2021-01-06 ENCOUNTER — Telehealth: Payer: Self-pay | Admitting: Orthopaedic Surgery

## 2021-01-06 MED ORDER — OXYCODONE-ACETAMINOPHEN 5-325 MG PO TABS: 1.0000 | ORAL_TABLET | Freq: Four times a day (QID) | ORAL | 0 refills | Status: DC | PRN

## 2021-01-06 NOTE — Telephone Encounter (Signed)
Could you please advise since Dr. Erlinda Hong and [name] are out of the office? Patient is status post THA on 12/26/2020. Thanks.

## 2021-01-06 NOTE — Telephone Encounter (Signed)
noted 

## 2021-01-06 NOTE — Progress Notes (Signed)
I sent in refill

## 2021-01-06 NOTE — Telephone Encounter (Signed)
Pt need refill on oxycodone °

## 2021-01-10 ENCOUNTER — Encounter: Payer: Self-pay | Admitting: Orthopaedic Surgery

## 2021-01-10 ENCOUNTER — Other Ambulatory Visit: Payer: Self-pay

## 2021-01-10 ENCOUNTER — Ambulatory Visit (INDEPENDENT_AMBULATORY_CARE_PROVIDER_SITE_OTHER): Payer: 59 | Admitting: Physician Assistant

## 2021-01-10 DIAGNOSIS — Z96641 Presence of right artificial hip joint: Secondary | ICD-10-CM

## 2021-01-10 NOTE — Progress Notes (Signed)
° °  Post-Op Visit Note   Patient: Monica Cobb           Date of Birth: [birth year]           MRN: [id number] Visit Date: 01/10/2021 PCP: Darrick Huntsman, MD   Assessment & Plan:  Chief Complaint:  Chief Complaint  Patient presents with   Right Hip - Pain   Visit Diagnoses:  1. H/O total hip arthroplasty, right     Plan: Patient is a pleasant 65 year old female comes in today 2 weeks status post right total hip replacement 12/26/2020.  She has been doing well.  She has completed home health physical therapy and is ambulating primarily unassisted but does have a cane as needed.  She has minimal pain.  She has been taking aspirin as directed.  No chest pain, shortness of breath or calf pain.  Examination right hip reveals a well-healed surgical incision with nylon sutures in place.  No evidence of infection or cellulitis.  Calf is soft nontender.  She is neurovascular intact distally.  Today, sutures were removed and Steri-Strips applied.  She will continue with her baby aspirin twice daily for another 4 weeks.  She will continue with her home health exercises.  Follow-up with [place] in 4 weeks time for repeat evaluation AP pelvis x-rays.  Dental prophylaxis reinforced.  Call with concerns or questions.  Follow-Up Instructions: Return in about 4 weeks (around 02/07/2021).   Orders:  No orders of the defined types were placed in this encounter.  No orders of the defined types were placed in this encounter.   Imaging: No new imaging  PMFS History: Patient Active Problem List   Diagnosis Date Noted   Status post total replacement of right hip 12/26/2020   Pharyngitis, streptococcal, acute 12/10/2020   Primary hypertension 12/10/2020   Impetigo 12/10/2020   Sepsis ([place]) 12/09/2020   Anxiety 12/09/2020   Back pain 12/09/2020   Avascular necrosis of bone of right hip ([place]) 11/22/2020   Past Medical History:  Diagnosis Date   Cancer ([place])    Chronic kidney disease    CKD 3   Coronary  artery disease    GERD (gastroesophageal reflux disease)    Hypertension    PONV (postoperative nausea and vomiting)     Family History  Problem Relation Age of Onset   Breast cancer Maternal Grandmother     Past Surgical History:  Procedure Laterality Date   APPENDECTOMY     BACK SURGERY     laser back surgery for herniated disc   CARDIAC CATHETERIZATION     CHOLECYSTECTOMY     JOINT REPLACEMENT Left    left hip   LUMBAR FUSION     skin cancer removal     chest, face and legs   TOTAL HIP ARTHROPLASTY Right 12/26/2020   Procedure: RIGHT TOTAL HIP ARTHROPLASTY ANTERIOR APPROACH;  Surgeon: Leandrew Koyanagi, MD;  Location: [facility];  Service: Orthopedics;  Laterality: Right;  3-C   TUBAL LIGATION     Social History   Occupational History   Not on file  Tobacco Use   Smoking status: Former    Packs/day: 1.00    Types: Cigarettes   Smokeless tobacco: Never  Vaping Use   Vaping Use: Never used  Substance and Sexual Activity   Alcohol use: Not Currently   Drug use: Never   Sexual activity: Not Currently

## 2021-02-04 ENCOUNTER — Other Ambulatory Visit: Payer: Self-pay | Admitting: Physician Assistant

## 2021-02-07 ENCOUNTER — Ambulatory Visit (INDEPENDENT_AMBULATORY_CARE_PROVIDER_SITE_OTHER): Payer: 59 | Admitting: Orthopaedic Surgery

## 2021-02-07 ENCOUNTER — Other Ambulatory Visit: Payer: Self-pay

## 2021-02-07 ENCOUNTER — Ambulatory Visit (INDEPENDENT_AMBULATORY_CARE_PROVIDER_SITE_OTHER): Payer: 59

## 2021-02-07 DIAGNOSIS — Z96641 Presence of right artificial hip joint: Secondary | ICD-10-CM | POA: Diagnosis not present

## 2021-02-07 MED ORDER — TRAMADOL HCL 50 MG PO TABS: 50.0000 mg | ORAL_TABLET | Freq: Every day | ORAL | 0 refills | Status: AC | PRN

## 2021-02-07 NOTE — Progress Notes (Signed)
° °  Post-Op Visit Note   Patient: Monica Cobb           Date of Birth: 1955-09-16           MRN: [id number] Visit Date: 02/07/2021 PCP: Verita Lamb, NP   Assessment & Plan:  Chief Complaint:  Chief Complaint  Patient presents with   Right Hip - Routine Post Op    Right THA 12/26/20   Visit Diagnoses:  1. H/O total hip arthroplasty, right     Plan: [name] is 6 weeks status post right total hip replacement.  She is doing very well has no concerns or complaints.  She has completed physical therapy.  Right hip scar is healed.  No signs of infection.  Excellent range of motion.  Normal gait and ambulation.  X-rays show a stable right total hip replacement.  She will increase activity as tolerated.  Dental prophylaxis reinforced.  Recheck in 6 weeks.  Follow-Up Instructions: Return in about 6 weeks (around 03/21/2021).   Orders:  Orders Placed This Encounter  Procedures   XR Pelvis 1-2 Views   Meds ordered this encounter  Medications   traMADol (ULTRAM) 50 MG tablet    Sig: Take 1-2 tablets (50-100 mg total) by mouth daily as needed.    Dispense:  20 tablet    Refill:  0    Imaging: XR Pelvis 1-2 Views  Result Date: 02/07/2021 Stable total hip replacement without complications   PMFS History: Patient Active Problem List   Diagnosis Date Noted   Status post total replacement of right hip 12/26/2020   Pharyngitis, streptococcal, acute 12/10/2020   Primary hypertension 12/10/2020   Impetigo 12/10/2020   Sepsis ([place]) 12/09/2020   Anxiety 12/09/2020   Back pain 12/09/2020   Avascular necrosis of bone of right hip ([place]) 11/22/2020   Past Medical History:  Diagnosis Date   Cancer ([place])    Chronic kidney disease    CKD 3   Coronary artery disease    GERD (gastroesophageal reflux disease)    Hypertension    PONV (postoperative nausea and vomiting)     Family History  Problem Relation Age of Onset   Breast cancer Maternal Grandmother     Past Surgical History:   Procedure Laterality Date   APPENDECTOMY     BACK SURGERY     laser back surgery for herniated disc   CARDIAC CATHETERIZATION     CHOLECYSTECTOMY     JOINT REPLACEMENT Left    left hip   LUMBAR FUSION     skin cancer removal     chest, face and legs   TOTAL HIP ARTHROPLASTY Right 12/26/2020   Procedure: RIGHT TOTAL HIP ARTHROPLASTY ANTERIOR APPROACH;  Surgeon: Leandrew Koyanagi, MD;  Location: [facility];  Service: Orthopedics;  Laterality: Right;  3-C   TUBAL LIGATION     Social History   Occupational History   Not on file  Tobacco Use   Smoking status: Former    Packs/day: 1.00    Types: Cigarettes   Smokeless tobacco: Never  Vaping Use   Vaping Use: Never used  Substance and Sexual Activity   Alcohol use: Not Currently   Drug use: Never   Sexual activity: Not Currently

## 2021-02-22 ENCOUNTER — Other Ambulatory Visit: Payer: Self-pay | Admitting: Family

## 2021-02-22 DIAGNOSIS — Z1231 Encounter for screening mammogram for malignant neoplasm of breast: Secondary | ICD-10-CM

## 2021-03-21 ENCOUNTER — Encounter: Payer: Self-pay | Admitting: Orthopaedic Surgery

## 2021-03-21 ENCOUNTER — Other Ambulatory Visit: Payer: Self-pay

## 2021-03-21 ENCOUNTER — Ambulatory Visit (INDEPENDENT_AMBULATORY_CARE_PROVIDER_SITE_OTHER): Payer: 59 | Admitting: Orthopaedic Surgery

## 2021-03-21 DIAGNOSIS — M87051 Idiopathic aseptic necrosis of right femur: Secondary | ICD-10-CM

## 2021-03-21 NOTE — Progress Notes (Signed)
° °  Post-Op Visit Note   Patient: Monica Cobb           Date of Birth: 25-Oct-1955           MRN: [id number] Visit Date: 03/21/2021 PCP: Verita Lamb, NP   Assessment & Plan:  Chief Complaint:  Chief Complaint  Patient presents with   Right Hip - Follow-up    S/p right total hip arthroplasty   Visit Diagnoses:  1. Avascular necrosis of bone of right hip ([place])     Plan: [name] is 3 months status post right total hip replacement.  She is doing really well has no complaints.  She has resumed her baseline activities.  She is able to wear heels without any problems.  Examination of the right hip shows fully healed surgical scar with excellent range of motion.  Leg lengths are equal.  Adequate hip strength.  Normal gait.  Dental prophylaxis reinforced.  Patient is doing very well and so we can skip the 11-month visit.  We will see her back in 9 months for the 1 year visit with standing AP pelvis and lateral hip x-rays.  Follow-Up Instructions: Return in about 9 months (around 12/19/2021).   Orders:  No orders of the defined types were placed in this encounter.  No orders of the defined types were placed in this encounter.   Imaging: No results found.  PMFS History: Patient Active Problem List   Diagnosis Date Noted   Status post total replacement of right hip 12/26/2020   Pharyngitis, streptococcal, acute 12/10/2020   Primary hypertension 12/10/2020   Impetigo 12/10/2020   Sepsis ([place]) 12/09/2020   Anxiety 12/09/2020   Back pain 12/09/2020   Avascular necrosis of bone of right hip ([place]) 11/22/2020   Past Medical History:  Diagnosis Date   Cancer ([place])    Chronic kidney disease    CKD 3   Coronary artery disease    GERD (gastroesophageal reflux disease)    Hypertension    PONV (postoperative nausea and vomiting)     Family History  Problem Relation Age of Onset   Breast cancer Maternal Grandmother     Past Surgical History:  Procedure Laterality Date    APPENDECTOMY     BACK SURGERY     laser back surgery for herniated disc   CARDIAC CATHETERIZATION     CHOLECYSTECTOMY     JOINT REPLACEMENT Left    left hip   LUMBAR FUSION     skin cancer removal     chest, face and legs   TOTAL HIP ARTHROPLASTY Right 12/26/2020   Procedure: RIGHT TOTAL HIP ARTHROPLASTY ANTERIOR APPROACH;  Surgeon: Leandrew Koyanagi, MD;  Location: [facility];  Service: Orthopedics;  Laterality: Right;  3-C   TUBAL LIGATION     Social History   Occupational History   Not on file  Tobacco Use   Smoking status: Former    Packs/day: 1.00    Types: Cigarettes   Smokeless tobacco: Never  Vaping Use   Vaping Use: Never used  Substance and Sexual Activity   Alcohol use: Not Currently   Drug use: Never   Sexual activity: Not Currently

## 2021-03-31 ENCOUNTER — Other Ambulatory Visit: Payer: Self-pay

## 2021-03-31 ENCOUNTER — Ambulatory Visit
Admission: RE | Admit: 2021-03-31 | Discharge: 2021-03-31 | Disposition: A | Discharge status: Home / Self Care | Payer: 59 | Source: Ambulatory Visit | Attending: Family | Admitting: Family

## 2021-03-31 DIAGNOSIS — Z1231 Encounter for screening mammogram for malignant neoplasm of breast: Secondary | ICD-10-CM | POA: Insufficient documentation

## 2021-04-03 ENCOUNTER — Inpatient Hospital Stay
Admission: RE | Admit: 2021-04-03 | Discharge: 2021-04-03 | Disposition: A | Discharge status: Home / Self Care | Payer: Self-pay | Source: Ambulatory Visit | Attending: *Deleted | Admitting: *Deleted

## 2021-04-03 ENCOUNTER — Other Ambulatory Visit: Payer: Self-pay | Admitting: *Deleted

## 2021-04-03 DIAGNOSIS — Z1231 Encounter for screening mammogram for malignant neoplasm of breast: Secondary | ICD-10-CM

## 2021-07-17 ENCOUNTER — Ambulatory Visit
Admission: EM | Admit: 2021-07-17 | Discharge: 2021-07-17 | Disposition: A | Discharge status: Home / Self Care | Payer: 59 | Attending: Internal Medicine | Admitting: Internal Medicine

## 2021-07-17 ENCOUNTER — Ambulatory Visit (INDEPENDENT_AMBULATORY_CARE_PROVIDER_SITE_OTHER): Payer: 59

## 2021-07-17 DIAGNOSIS — R059 Cough, unspecified: Secondary | ICD-10-CM | POA: Diagnosis not present

## 2021-07-17 DIAGNOSIS — J209 Acute bronchitis, unspecified: Secondary | ICD-10-CM

## 2021-07-17 DIAGNOSIS — R0602 Shortness of breath: Secondary | ICD-10-CM

## 2021-07-17 LAB — CBC WITH DIFFERENTIAL/PLATELET
Abs Immature Granulocytes: 0.08 10*3/uL — ABNORMAL HIGH (ref 0.00–0.07)
Basophils Absolute: 0.1 10*3/uL (ref 0.0–0.1)
Basophils Relative: 1 %
Eosinophils Absolute: 0.1 10*3/uL (ref 0.0–0.5)
Eosinophils Relative: 1 %
HCT: 41 % (ref 36.0–46.0)
Hemoglobin: 13.4 g/dL (ref 12.0–15.0)
Immature Granulocytes: 1 %
Lymphocytes Relative: 42 %
Lymphs Abs: 3.9 10*3/uL (ref 0.7–4.0)
MCH: 31.1 pg (ref 26.0–34.0)
MCHC: 32.7 g/dL (ref 30.0–36.0)
MCV: 95.1 fL (ref 80.0–100.0)
Monocytes Absolute: 0.6 10*3/uL (ref 0.1–1.0)
Monocytes Relative: 7 %
Neutro Abs: 4.5 10*3/uL (ref 1.7–7.7)
Neutrophils Relative %: 48 %
Platelets: 240 10*3/uL (ref 150–400)
RBC: 4.31 MIL/uL (ref 3.87–5.11)
RDW: 16.5 % — ABNORMAL HIGH (ref 11.5–15.5)
WBC: 9.2 10*3/uL (ref 4.0–10.5)
nRBC: 0 % (ref 0.0–0.2)

## 2021-07-17 MED ORDER — BENZONATATE 100 MG PO CAPS: 100.0000 mg | ORAL_CAPSULE | Freq: Three times a day (TID) | ORAL | 0 refills | Status: AC | PRN

## 2021-07-17 MED ORDER — ALBUTEROL SULFATE HFA 108 (90 BASE) MCG/ACT IN AERS: 1.0000 | INHALATION_SPRAY | Freq: Four times a day (QID) | RESPIRATORY_TRACT | 0 refills | Status: AC | PRN

## 2021-07-17 MED ORDER — DOXYCYCLINE HYCLATE 100 MG PO CAPS: 100.0000 mg | ORAL_CAPSULE | Freq: Two times a day (BID) | ORAL | 0 refills | Status: AC

## 2021-07-17 MED ORDER — BENZONATATE 100 MG PO CAPS: 100.0000 mg | ORAL_CAPSULE | Freq: Three times a day (TID) | ORAL | 0 refills | Status: DC | PRN

## 2021-07-17 MED ORDER — ALBUTEROL SULFATE HFA 108 (90 BASE) MCG/ACT IN AERS: 1.0000 | INHALATION_SPRAY | Freq: Four times a day (QID) | RESPIRATORY_TRACT | 0 refills | Status: DC | PRN

## 2021-07-17 MED ORDER — PREDNISONE 20 MG PO TABS: 20.0000 mg | ORAL_TABLET | Freq: Every day | ORAL | 0 refills | Status: DC

## 2021-07-17 MED ORDER — DOXYCYCLINE HYCLATE 100 MG PO CAPS: 100.0000 mg | ORAL_CAPSULE | Freq: Two times a day (BID) | ORAL | 0 refills | Status: DC

## 2021-07-17 MED ORDER — PREDNISONE 20 MG PO TABS: 20.0000 mg | ORAL_TABLET | Freq: Every day | ORAL | 0 refills | Status: AC

## 2021-07-20 NOTE — ED Provider Notes (Signed)
[facility]    CSN: [id number] Arrival date & time: 07/17/21  1913      History   Chief Complaint Chief Complaint  Patient presents with   Cough    Breathing heavy tired - Entered by patient   Shortness of Breath   Fatigue   Headache    HPI Monica Cobb is a 66 y.o. female comes to urgent care with 2-week history of shortness of breath, chest tightness, nonproductive cough, headache and generalized malaise.  Patient's symptoms started 2 weeks ago and has been persistent.  No fever or chills.  No nausea or vomiting.  Patient has tried Mucinex, OTC allergy medications with no improvement.  No chest pain or chest pressure.  Patient has a history of smoking.  She has over 20-pack-year history of smoking but does not use any albuterol inhaler or other bronchodilators at this time.  HPI  Past Medical History:  Diagnosis Date   Cancer ([place])    Chronic kidney disease    CKD 3   Coronary artery disease    GERD (gastroesophageal reflux disease)    Hypertension    PONV (postoperative nausea and vomiting)     Patient Active Problem List   Diagnosis Date Noted   Status post total replacement of right hip 12/26/2020   Pharyngitis, streptococcal, acute 12/10/2020   Primary hypertension 12/10/2020   Impetigo 12/10/2020   Sepsis ([place]) 12/09/2020   Anxiety 12/09/2020   Back pain 12/09/2020   Avascular necrosis of bone of right hip ([place]) 11/22/2020   Primary localized osteoarthritis of left hip 01/13/2019   Colitis 03/08/2018   Mixed hyperlipidemia 08/05/2017   Pharyngoesophageal dysphagia 02/20/2016   Primary insomnia 11/02/2015   Neck pain 08/23/2015   Pain management contract agreement 08/02/2015   CKD (chronic kidney disease) stage 3, GFR 30-59 ml/min (HCC) 04/29/2015   Chronic bilateral low back pain without sciatica 02/17/2015   Radiculopathy of leg 02/17/2015   GERD (gastroesophageal reflux disease) 12/05/2012   IBS (irritable bowel syndrome) 12/05/2012    History of nonmelanoma skin cancer 05/05/2012   Actinic keratosis 01/03/2011   Basal cell carcinoma of face 06/07/2010    Past Surgical History:  Procedure Laterality Date   APPENDECTOMY     BACK SURGERY     laser back surgery for herniated disc   CARDIAC CATHETERIZATION     CHOLECYSTECTOMY     JOINT REPLACEMENT Left    left hip   LUMBAR FUSION     skin cancer removal     chest, face and legs   TOTAL HIP ARTHROPLASTY Right 12/26/2020   Procedure: RIGHT TOTAL HIP ARTHROPLASTY ANTERIOR APPROACH;  Surgeon: Leandrew Koyanagi, MD;  Location: [facility];  Service: Orthopedics;  Laterality: Right;  3-C   TUBAL LIGATION      OB History   No obstetric history on file.      Home Medications    Prior to Admission medications   Medication Sig Start Date End Date Taking? Authorizing Provider  albuterol (VENTOLIN HFA) 108 (90 Base) MCG/ACT inhaler Inhale 1-2 puffs into the lungs every 6 (six) hours as needed for wheezing or shortness of breath. 07/17/21   Daveah Varone, Myrene Galas, MD  amitriptyline (ELAVIL) 100 MG tablet Take 100 mg by mouth at bedtime. 03/04/18   [provider]  atorvastatin (LIPITOR) 20 MG tablet Take 20 mg by mouth daily. 03/13/18   [provider]  benzonatate (TESSALON) 100 MG capsule Take 1 capsule (100 mg total) by mouth 3 (  three) times daily as needed for cough. 07/17/21   Shenandoah Vandergriff, Myrene Galas, MD  clonazePAM (KLONOPIN) 1 MG tablet Take 1 mg by mouth 2 (two) times daily as needed for anxiety. 08/25/14   [provider]  dicyclomine (BENTYL) 10 MG capsule Take 10 mg by mouth 4 (four) times daily as needed for spasms. 10/13/19   [provider]  doxycycline (VIBRAMYCIN) 100 MG capsule Take 1 capsule (100 mg total) by mouth 2 (two) times daily for 7 days. 07/17/21 07/24/21  Chase Picket, MD  estradiol (CLIMARA - DOSED IN MG/24 HR) 0.05 mg/24hr patch Place 0.05 mg onto the skin 2 (two) times a week. 01/28/18   [provider]  furosemide (LASIX) 20 MG  tablet Take 20 mg by mouth daily. 10/06/19   [provider]  Hyoscyamine Sulfate SL 0.125 MG SUBL Place 0.125 mg under the tongue daily as needed (IBS). 12/28/15   [provider]  pantoprazole (PROTONIX) 40 MG tablet Take 40 mg by mouth 2 (two) times daily. 08/03/14   [provider]  predniSONE (DELTASONE) 20 MG tablet Take 1 tablet (20 mg total) by mouth daily for 5 days. 07/17/21 07/22/21  Chase Picket, MD  traMADol (ULTRAM) 50 MG tablet Take 1-2 tablets (50-100 mg total) by mouth daily as needed. 02/07/21   Leandrew Koyanagi, MD  varenicline (CHANTIX) 1 MG tablet Take 1 mg by mouth 2 (two) times daily.    [provider]    Family History Family History  Problem Relation Age of Onset   Breast cancer Maternal Grandmother     Social History Social History   Tobacco Use   Smoking status: Former    Packs/day: 1.00    Types: Cigarettes   Smokeless tobacco: Never  Vaping Use   Vaping Use: Never used  Substance Use Topics   Alcohol use: Not Currently   Drug use: Never     Allergies   Patient has no known allergies.   Review of Systems Review of Systems  HENT: Negative.  Negative for sinus pressure, sneezing and sore throat.   Respiratory:  Positive for cough, chest tightness and shortness of breath. Negative for wheezing and stridor.   Cardiovascular:  Negative for chest pain.  Genitourinary: Negative.   Neurological: Negative.      Physical Exam Triage Vital Signs ED Triage Vitals  Enc Vitals Group     BP 07/17/21 1948 (!) 158/76     Pulse Rate 07/17/21 1948 74     Resp 07/17/21 1948 16     Temp 07/17/21 1948 98.1 F (36.7 C)     Temp Source 07/17/21 1948 Oral     SpO2 07/17/21 1948 95 %     Weight --      Height --      Head Circumference --      Peak Flow --      Pain Score 07/17/21 1951 0     Pain Loc --      Pain Edu? --      Excl. in [place]? --    No data found.  Updated Vital Signs BP (!) 158/76 (BP Location: Right  Arm)   Pulse 74   Temp 98.1 F (36.7 C) (Oral)   Resp 16   SpO2 95%   Visual Acuity Right Eye Distance:   Left Eye Distance:   Bilateral Distance:    Right Eye Near:   Left Eye Near:    Bilateral Near:  Physical Exam Vitals and nursing note reviewed.  Constitutional:      General: She is not in acute distress.    Appearance: She is not ill-appearing.  HENT:     Head: Normocephalic.  Cardiovascular:     Rate and Rhythm: Normal rate and regular rhythm.  Pulmonary:     Breath sounds: Examination of the right-lower field reveals decreased breath sounds. Examination of the left-lower field reveals decreased breath sounds. Decreased breath sounds present. No wheezing, rhonchi or rales.  Abdominal:     General: Bowel sounds are normal.     Palpations: Abdomen is soft.  Neurological:     Mental Status: She is alert.      UC Treatments / Results  Labs (all labs ordered are listed, but only abnormal results are displayed) Labs Reviewed  CBC WITH DIFFERENTIAL/PLATELET - Abnormal; Notable for the following components:      Result Value   RDW 16.5 (*)    Abs Immature Granulocytes 0.08 (*)    All other components within normal limits    EKG   Radiology No results found.  Procedures Procedures (including critical care time)  Medications Ordered in UC Medications - No data to display  Initial Impression / Assessment and Plan / UC Course  I have reviewed the triage vital signs and the nursing notes.  Pertinent labs & imaging results that were available during my care of the patient were reviewed by me and considered in my medical decision making (see chart for details).     1.  Acute bronchitis with bronchospasm: Albuterol inhaler Tessalon Perles as needed for cough Doxycycline 100 mg twice daily for 7 days Prednisone 40 mg orally daily for 5 days Chest x-ray is negative for acute lung infiltrates Smoke cessation advice given.  Patient is in the contemplative  stage of smoke cessation. Time spent on smoke cessation counselling is less than 10 minutes Return precautions given. Final Clinical Impressions(s) / UC Diagnoses   Final diagnoses:  Acute bronchitis with bronchospasm     Discharge Instructions      Please take your medication as prescribed Your chest X-Ray is negative  Smoke cessation will help with your symptoms Return to urgent care if you have worsening symptoms.   ED Prescriptions     Medication Sig Dispense Auth. Provider   predniSONE (DELTASONE) 20 MG tablet  (Status: Discontinued) Take 1 tablet (20 mg total) by mouth daily for 5 days. 5 tablet Barack Nicodemus, Myrene Galas, MD   albuterol (VENTOLIN HFA) 108 (90 Base) MCG/ACT inhaler  (Status: Discontinued) Inhale 1-2 puffs into the lungs every 6 (six) hours as needed for wheezing or shortness of breath. 18 g Angelo Prindle, Myrene Galas, MD   doxycycline (VIBRAMYCIN) 100 MG capsule  (Status: Discontinued) Take 1 capsule (100 mg total) by mouth 2 (two) times daily for 7 days. 14 capsule Ozzie Knobel, Myrene Galas, MD   benzonatate (TESSALON) 100 MG capsule  (Status: Discontinued) Take 1 capsule (100 mg total) by mouth 3 (three) times daily as needed for cough. 30 capsule Nico Rogness, Myrene Galas, MD   albuterol (VENTOLIN HFA) 108 (90 Base) MCG/ACT inhaler Inhale 1-2 puffs into the lungs every 6 (six) hours as needed for wheezing or shortness of breath. 18 g Azizi Bally, Myrene Galas, MD   benzonatate (TESSALON) 100 MG capsule Take 1 capsule (100 mg total) by mouth 3 (three) times daily as needed for cough. 30 capsule Aislin Onofre, Myrene Galas, MD   doxycycline (VIBRAMYCIN) 100 MG capsule Take 1 capsule (100  mg total) by mouth 2 (two) times daily for 7 days. 14 capsule Tawsha Terrero, Myrene Galas, MD   predniSONE (DELTASONE) 20 MG tablet Take 1 tablet (20 mg total) by mouth daily for 5 days. 5 tablet Bayne Fosnaugh, Myrene Galas, MD      PDMP not reviewed this encounter.   Chase Picket, MD 07/20/21 1743

## 2021-12-19 ENCOUNTER — Ambulatory Visit: Payer: 59 | Admitting: Orthopaedic Surgery

## 2021-12-26 ENCOUNTER — Ambulatory Visit (INDEPENDENT_AMBULATORY_CARE_PROVIDER_SITE_OTHER): Payer: BLUE CROSS/BLUE SHIELD

## 2021-12-26 ENCOUNTER — Encounter: Payer: Self-pay | Admitting: Orthopaedic Surgery

## 2021-12-26 ENCOUNTER — Ambulatory Visit: Payer: BLUE CROSS/BLUE SHIELD | Admitting: Orthopaedic Surgery

## 2021-12-26 DIAGNOSIS — Z96641 Presence of right artificial hip joint: Secondary | ICD-10-CM | POA: Diagnosis not present

## 2021-12-26 NOTE — Progress Notes (Signed)
Post-Op Visit Note   Patient: Monica Cobb           Date of Birth: 1955-10-29           MRN: [id number] Visit Date: 12/26/2021 PCP: Verita Lamb, NP   Assessment & Plan:  Chief Complaint:  Chief Complaint  Patient presents with   Right Hip - Follow-up    Right total hip arthroplasty 12/26/2020   Visit Diagnoses:  1. H/O total hip arthroplasty, right     Plan: Shaylee is 1 year status post right total hip replacement for avascular necrosis.  She is doing well has no complaints.  She is very pleased with her outcome.  Examination of right hip shows a fully healed surgical scar.  She has fluid painless range of motion of the hip.  She has good normal gait pattern.  X-rays demonstrate stable right total hip replacement without any complications.  At this point we will see her back in another year for her 2-year visit.  Dental prophylaxis reinforced.  She will need x-rays on return.  Follow-Up Instructions: Return in about 1 year (around 12/27/2022).   Orders:  Orders Placed This Encounter  Procedures   XR HIP UNILAT W OR W/O PELVIS 2-3 VIEWS RIGHT   No orders of the defined types were placed in this encounter.   Imaging: XR HIP UNILAT W OR W/O PELVIS 2-3 VIEWS RIGHT  Result Date: 12/26/2021 2 view x-rays of the right hip shows stable right total hip replacement in good position without any complications.   PMFS History: Patient Active Problem List   Diagnosis Date Noted   Status post total replacement of right hip 12/26/2020   Pharyngitis, streptococcal, acute 12/10/2020   Primary hypertension 12/10/2020   Impetigo 12/10/2020   Sepsis ([place]) 12/09/2020   Anxiety 12/09/2020   Back pain 12/09/2020   Avascular necrosis of bone of right hip ([place]) 11/22/2020   Primary localized osteoarthritis of left hip 01/13/2019   Colitis 03/08/2018   Mixed hyperlipidemia 08/05/2017   Pharyngoesophageal dysphagia 02/20/2016   Primary insomnia 11/02/2015   Neck pain 08/23/2015   Pain  management contract agreement 08/02/2015   CKD (chronic kidney disease) stage 3, GFR 30-59 ml/min (HCC) 04/29/2015   Chronic bilateral low back pain without sciatica 02/17/2015   Radiculopathy of leg 02/17/2015   GERD (gastroesophageal reflux disease) 12/05/2012   IBS (irritable bowel syndrome) 12/05/2012   History of nonmelanoma skin cancer 05/05/2012   Actinic keratosis 01/03/2011   Basal cell carcinoma of face 06/07/2010   Past Medical History:  Diagnosis Date   Cancer ([place])    Chronic kidney disease    CKD 3   Coronary artery disease    GERD (gastroesophageal reflux disease)    Hypertension    PONV (postoperative nausea and vomiting)     Family History  Problem Relation Age of Onset   Breast cancer Maternal Grandmother     Past Surgical History:  Procedure Laterality Date   APPENDECTOMY     BACK SURGERY     laser back surgery for herniated disc   CARDIAC CATHETERIZATION     CHOLECYSTECTOMY     JOINT REPLACEMENT Left    left hip   LUMBAR FUSION     skin cancer removal     chest, face and legs   TOTAL HIP ARTHROPLASTY Right 12/26/2020   Procedure: RIGHT TOTAL HIP ARTHROPLASTY ANTERIOR APPROACH;  Surgeon: Leandrew Koyanagi, MD;  Location: [facility];  Service: Orthopedics;  Laterality: Right;  3-C   TUBAL LIGATION     Social History   Occupational History   Not on file  Tobacco Use   Smoking status: Former    Packs/day: 1.00    Types: Cigarettes   Smokeless tobacco: Never  Vaping Use   Vaping Use: Never used  Substance and Sexual Activity   Alcohol use: Not Currently   Drug use: Never   Sexual activity: Not Currently

## 2022-07-11 ENCOUNTER — Encounter: Payer: Self-pay | Admitting: Internal Medicine

## 2022-07-11 DIAGNOSIS — G4719 Other hypersomnia: Secondary | ICD-10-CM

## 2022-07-17 NOTE — Procedures (Signed)
[facility]  Polysomnogram Report Part I                                                               Phone: [phone] Fax: [phone]  Patient Name: Monica Cobb, Monica Cobb. Acquisition Number: [id number]  Date of Birth: 09-05-1955 Acquisition Date: 07/11/2022  Referring Physician: Elmyra Ricks. Deen MD     History: The patient is a 67 year old  who was referred for evaluation of . Medical History: History of nonmelanoma skin cancer, actinic keratosis, basal cell carcinoma of face, IBS, GERD, CKD, cervicalgia, chronic pain syndrome, mixed hyperlipidemia, anxiety, colitis. chronic kidnet disease, high cholesterol, hypertension.  Medications: albuterol, amitriptyline, benzonatate, clonaze, dicyclomine, ergocalciferol, estradiol, hyoscyamine, multivitamin, muplrocin, pantoprazole, valacyclovir, varenicline, rosuvastatin.  Procedure: This routine overnight polysomnogram was performed on the [facility] 5 using the standard diagnostic protocol. This included 6 channels of EEG, 2 channels of EOG, chin EMG, bilateral anterior tibialis EMG, nasal/oral thermistor, PTAF (nasal pressure transducer), chest and abdominal wall movements, EKG, and pulse oximetry.  Description: The total recording time was 476.0 minutes. The total sleep time was 435.0 minutes. There were a total of 25.5 minutes of wakefulness after sleep onset for a slightly reducedsleep efficiency of 91.4%. The latency to sleep onset was within normal limits at 15.5 minutes. The R sleep onset latency wasprolonged at 428.5 minutes. Sleep parameters, as a percentage of the total sleep time, demonstrated 7.1% of sleep was in N1 sleep, 88.0% N2, 0.0% N3 and 4.8% R sleep. There were a total of 16 arousals for an arousal index of 2.2 arousals per hour of sleep that was normal.  Respiratory monitoring demonstrated   snoring . There were 42 apneas and hypopneas for an Apnea Hypopnea Index of 5.8 apneas and hypopneas per hour of sleep. The REM related apnea  hypopnea index was 28.6/hr of REM sleep compared to a NREM AHI of 4.6/hr.  The average duration of the respiratory events was 17.9 seconds with a maximum duration of 42.0 seconds. The respiratory events occurred . The respiratory events were associated with peripheral oxygen desaturations on the average to 90%. The lowest oxygen desaturation associated with a respiratory event was 85%. Additionally, the baseline oxygen saturation during wakefulness was 96%, during NREM sleep averaged 94%, and during REM sleep averaged 94%. The total duration of oxygen < 90% was 2.1 minutes.  Cardiac monitoring-  demonstrate transient cardiac decelerations associated with the apneas.  significant cardiac rhythm irregularities.   Periodic limb movement monitoring- did not demonstrate periodic limb movements.   Impression: This routine overnight polysomnogram demonstrated significant, REM-related obstructive sleep apnea with an overall Apnea Hypopnea Index of 5.8 apneas and hypopneas per hour of sleep, which increased to 28.6 in REM sleep. As REM percentage was markedly reduced, the findings likely underestimate the severity of the sleep apnea. The lowest desaturation was to 85%.    slightly reduced sleep efficiency withincreased awakenings, a reduced REM percentage and no slow wave sleep. These findings would appear to be due to the obstructive sleep apnea.  Recommendations:    A CPAP titration would be recommended for the sleep apnea.  Would recommend weight loss in a patient with a BMI of 26.4.  Alternative treatment options may include an oral appliance, a nasal resistance device,  or ENT surgery in the appropriate clinical context.     Yevonne Pax, MD, [facility] Diplomate ABMS-Pulmonary, Critical Care and Sleep Medicine  Electronically reviewed and digitally signed  [facility] Polysomnogram Report Part II  Phone: [phone] Fax: [phone]  Patient last name [facility] Neck Size    in.  Acquisition [id number]  Patient first name Monica Cobb. Weight 149.0 lbs. Started 07/11/2022 at 10:14:36 PM  Birth date 1955/06/13 Height 63.0 in. Stopped 07/12/2022 at 6:20:30 AM  Age 27 BMI 26.4 lb/in2 Duration 476.0  Study Type Adult      [place] B. RPSGT / Ricky P.  Reviewed by: Valentino Hue Henke, PhD, ABSM, FAASM Sleep Data: Lights Out: 10:21:36 PM Sleep Onset: 10:37:06 PM  Lights On: 6:17:36 AM Sleep Efficiency: 91.4 %  Total Recording Time: 476.0 min Sleep Latency (from Lights Off) 15.5 min  Total Sleep Time (TST): 435.0 min R Latency (from Sleep Onset): 428.5 min  Sleep Period Time: 460.0 min Total number of awakenings: 18  Wake during sleep: 25.0 min Wake After Sleep Onset (WASO): 25.5 min   Sleep Data:         Arousal Summary: Stage  Latency from lights out (min) Latency from sleep onset (min) Duration (min) % Total Sleep Time  Normal values  N 1 15.5 0.0 31.0 7.1 (5%)  N 2 40.5 25.0 383.0 88.0 (50%)  N 3       0.0 0.0 (20%)  R 444.0 428.5 21.0 4.8 (25%)   Number Index  Spontaneous 15 2.1  Apneas & Hypopneas 1 0.1  RERAs 0 0.0       (Apneas & Hypopneas & RERAs)  (1) (0.1)  Limb Movement 0 0.0  Snore 0 0.0  TOTAL 16 2.2     Respiratory Data:  CA OA MA Apnea Hypopnea* A+ H RERA Total  Number 0 2 0 2 40 42 0 42  Mean Dur (sec) 0.0 23.8 0.0 23.8 17.6 17.9 0.0 17.9  Max Dur (sec) 0.0 27.5 0.0 27.5 42.0 42.0 0.0 42.0  Total Dur (min) 0.0 0.8 0.0 0.8 11.8 12.5 0.0 12.5  % of TST 0.0 0.2 0.0 0.2 2.7 2.9 0.0 2.9  Index (#/h TST) 0.0 0.3 0.0 0.3 5.5 5.8 0.0 5.8  *Hypopneas scored based on 4% or greater desaturation.  Sleep Stage:        REM NREM TST  AHI 28.6 4.6 5.8  RDI 28.6 4.6 5.8           Body Position Data:  Sleep (min) TST (%) REM (min) NREM (min) CA (#) OA (#) MA (#) HYP (#) AHI (#/h) RERA (#) RDI (#/h) Desat (#)  Supine 246.7 56.71 0.0 246.7 0 0 0 25 6.1 0 6.1 29  Non-Supine 188.30 43.29 21.00 167.30 0.00 2.00 0.00 15.00 5.42 0 5.42 20.00  Left:  183.3 42.14 21.0 162.3 0 2 0 15 5.6 0 5.6 20  Right: 5.0 1.15 0.0 5.0 0 0 0 0 0.0 0 0.00 0     Snoring: Total number of snoring episodes  0  Total time with snoring    min (   % of sleep)   Oximetry Distribution:             WK REM NREM TOTAL  Average (%)   96 94 94 94  < 90% 0.1 1.5 0.5 2.1  < 80% 0.0 0.0 0.0 0.0  < 70% 0.0 0.0 0.0 0.0  # of Desaturations* 0 13 36 49  Desat Index (#/hour) 0.0 37.1 5.2 6.8  Desat Max (%) 0 13 12 13   Desat Max Dur (sec) 0.0 65.0 67.0 67.0  Approx Min O2 during sleep 85  Approx min O2 during a respiratory event 85  Was Oxygen added (Y/N) and final rate :    LPM  *Desaturations based on 3% or greater drop from baseline.   Cheyne Stokes Breathing: None Present    Heart Rate Summary:  Average Heart Rate During Sleep 80.4 bpm      Highest Heart Rate During Sleep (95th %) 83.0 bpm      Highest Heart Rate During Sleep 214 bpm (artifact)  Highest Heart Rate During Recording (TIB) 225 bpm (artifact)   Heart Rate Observations: Event Type # Events   Bradycardia 0 Lowest HR Scored: N/A  Sinus Tachycardia During Sleep 0 Highest HR Scored: N/A  Narrow Complex Tachycardia 0 Highest HR Scored: N/A  Wide Complex Tachycardia 0 Highest HR Scored: N/A  Asystole 0 Longest Pause: N/A  Atrial Fibrillation 0 Duration Longest Event: N/A  Other Arrythmias   Type:    Periodic Limb Movement Data: (Primary legs unless otherwise noted) Total # Limb Movement 133 Limb Movement Index 18.3  Total # PLMS 125 PLMS Index 17.2  Total # PLMS Arousals    PLMS Arousal Index     Percentage Sleep Time with PLMS 85.31min (19.6 % sleep)  Mean Duration limb movements (secs) 850.8

## 2022-12-27 ENCOUNTER — Ambulatory Visit: Payer: BLUE CROSS/BLUE SHIELD | Admitting: Orthopaedic Surgery

## 2023-04-26 ENCOUNTER — Other Ambulatory Visit: Payer: Self-pay | Admitting: Dermatology

## 2023-04-26 DIAGNOSIS — L570 Actinic keratosis: Secondary | ICD-10-CM

## 2023-04-26 DIAGNOSIS — L858 Other specified epidermal thickening: Secondary | ICD-10-CM

## 2023-04-30 ENCOUNTER — Ambulatory Visit

## 2023-04-30 ENCOUNTER — Other Ambulatory Visit: Payer: Self-pay | Admitting: Physician Assistant

## 2023-04-30 DIAGNOSIS — M7989 Other specified soft tissue disorders: Secondary | ICD-10-CM

## 2023-05-01 ENCOUNTER — Ambulatory Visit
Admission: RE | Admit: 2023-05-01 | Discharge: 2023-05-01 | Disposition: A | Discharge status: Home / Self Care | Source: Ambulatory Visit | Attending: Physician Assistant | Admitting: Physician Assistant

## 2023-05-01 ENCOUNTER — Ambulatory Visit

## 2023-05-01 DIAGNOSIS — M7989 Other specified soft tissue disorders: Secondary | ICD-10-CM | POA: Insufficient documentation

## 2023-11-25 ENCOUNTER — Encounter: Payer: Self-pay | Admitting: Radiology

## 2024-01-21 IMAGING — MG MM DIGITAL SCREENING BILAT W/ TOMO AND CAD
8 series · 8 of 24 positions shown · non-contrast
Comparison: Previous exam(s).

CLINICAL DATA: Screening.

EXAM:
DIGITAL SCREENING BILATERAL MAMMOGRAM WITH TOMOSYNTHESIS AND CAD
TECHNIQUE: Bilateral screening digital craniocaudal and mediolateral oblique
mammograms were obtained. Bilateral screening digital breast
tomosynthesis was performed. The images were evaluated with
computer-aided detection.

[R CC synth-2D]
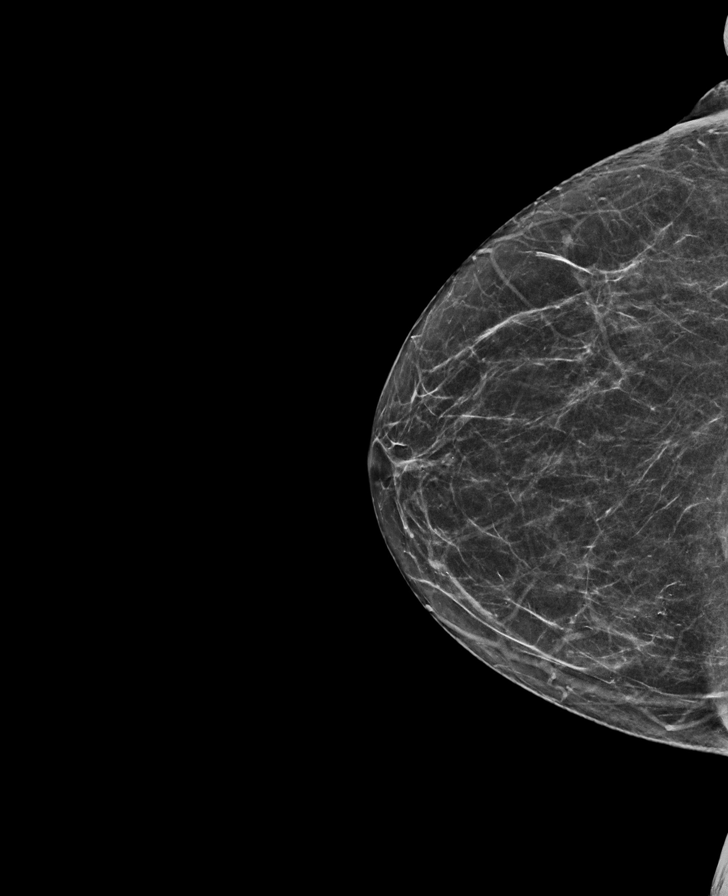

[L CC synth-2D]
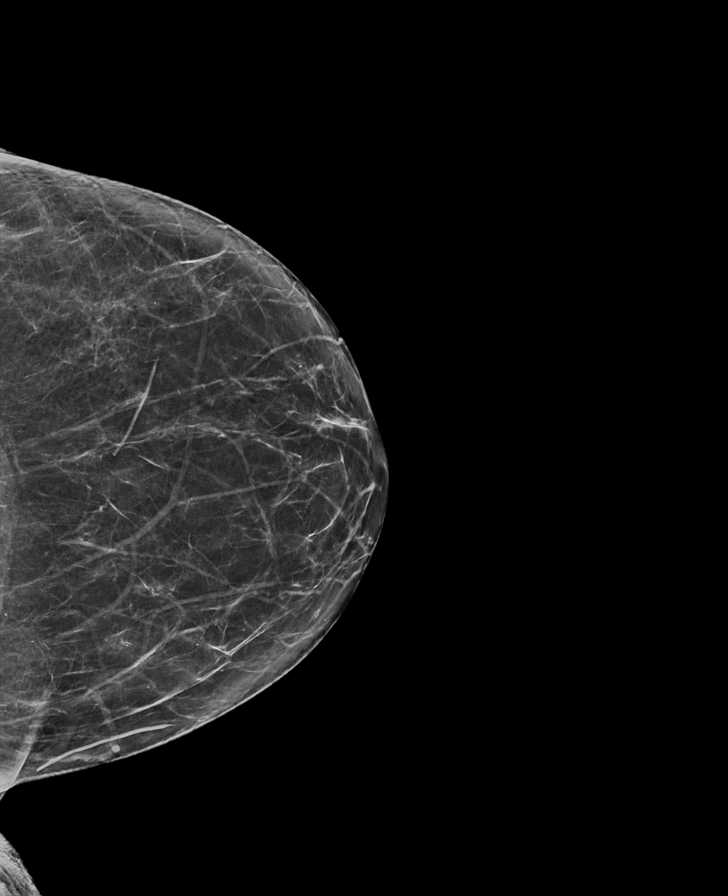

[R MLO synth-2D]
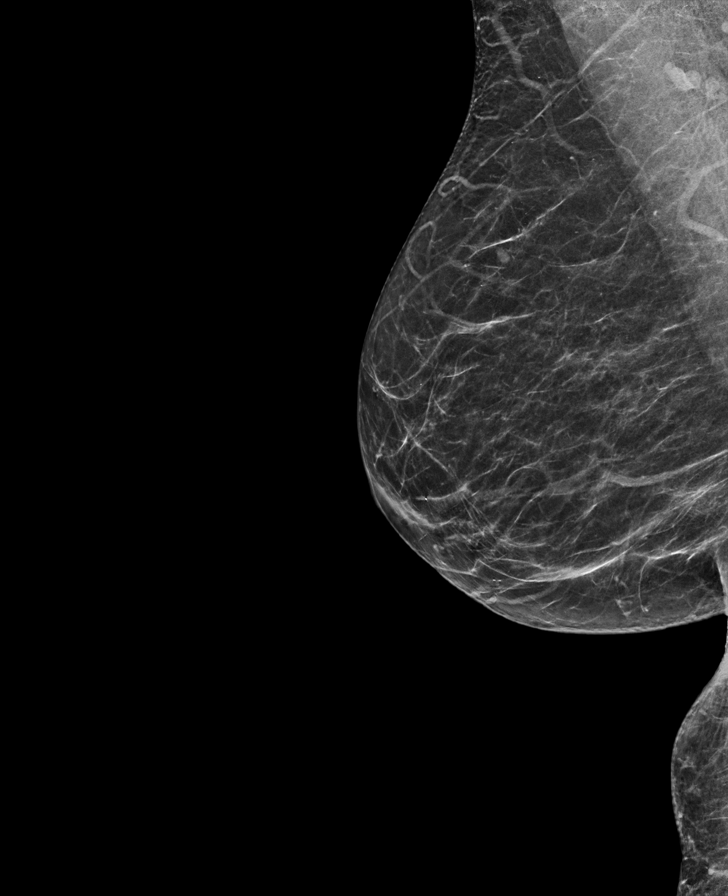

[L MLO synth-2D]
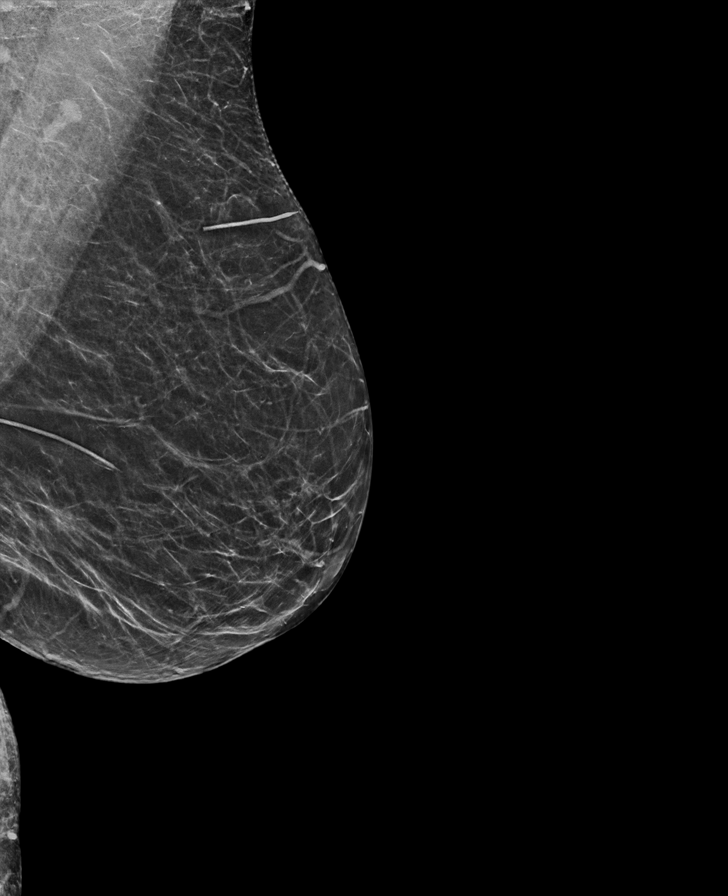

[L CC tomo · tomo slice 31/62.0]
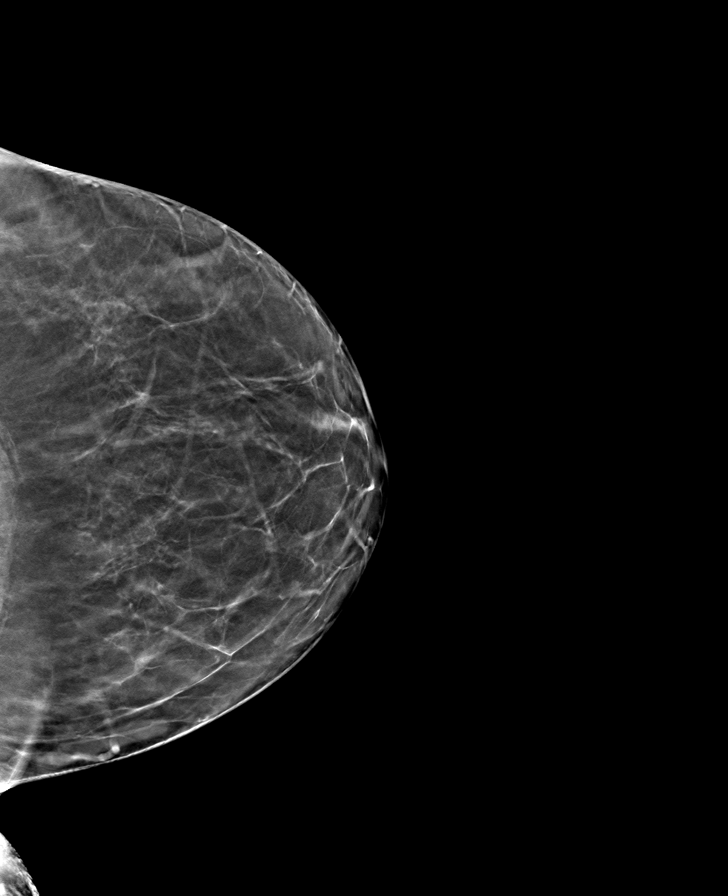

[R MLO tomo · tomo slice 30/59.0]
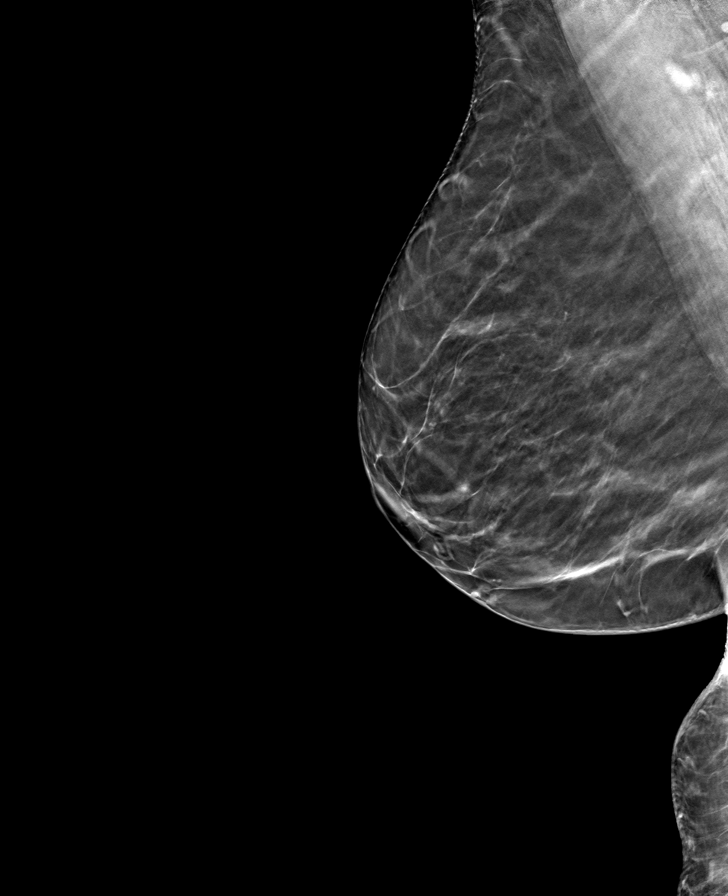

[R CC tomo · tomo slice 31/60.0]
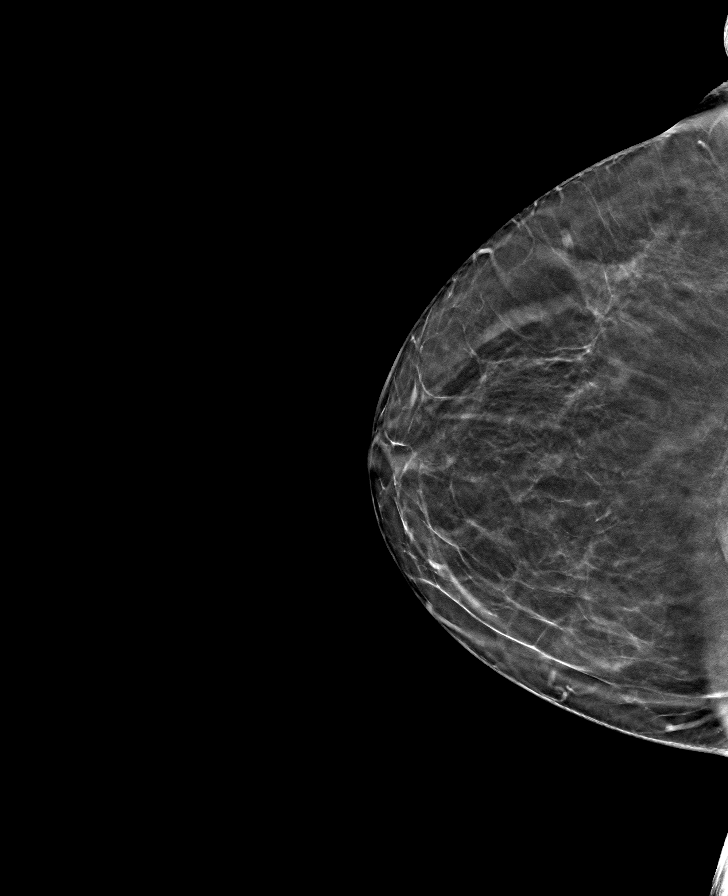

[L MLO tomo · tomo slice 32/63.0]
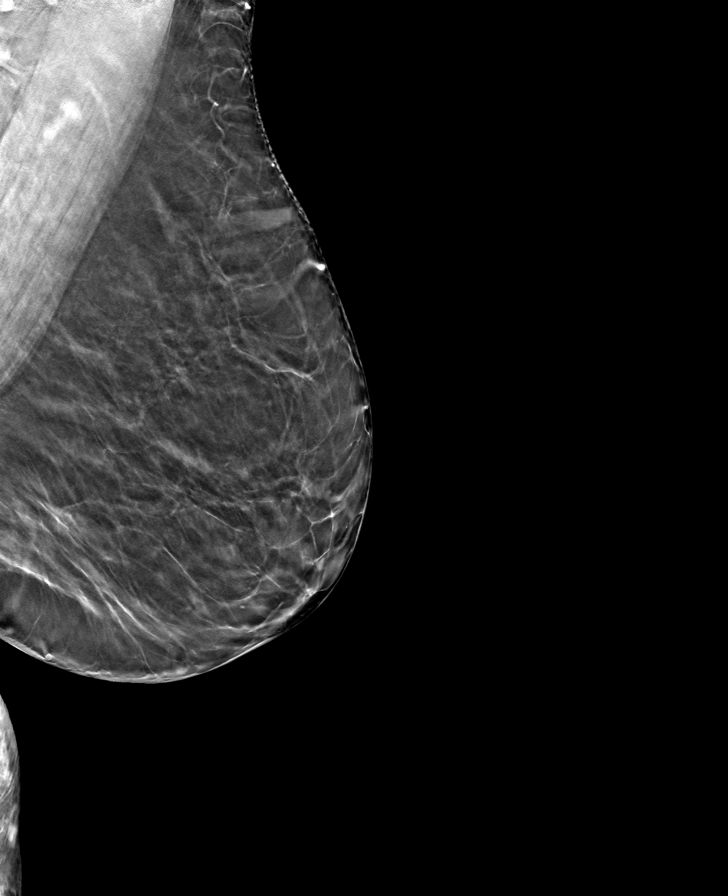

[8 of 24 positions shown; findings below may reference images not displayed]

ACR Breast Density Category b: There are scattered areas of
fibroglandular density.
FINDINGS: There are no findings suspicious for malignancy.
IMPRESSION: No mammographic evidence of malignancy. A result letter of this
screening mammogram will be mailed directly to the patient.

RECOMMENDATION:
Screening mammogram in one year. (Code:51-O-LD2)

BI-RADS CATEGORY  1: Negative.
